# Patient Record
Sex: Male | Born: 1945 | Race: Black or African American | Hispanic: No | Smoking: Current every day smoker
Health system: Southern US, Community
[De-identification: ages and names within clinical notes are randomized; demographics above are authoritative.]

## PROBLEM LIST (undated history)

## (undated) DIAGNOSIS — E785 Hyperlipidemia, unspecified: Secondary | ICD-10-CM

## (undated) DIAGNOSIS — D649 Anemia, unspecified: Secondary | ICD-10-CM

## (undated) DIAGNOSIS — N200 Calculus of kidney: Secondary | ICD-10-CM

## (undated) DIAGNOSIS — D126 Benign neoplasm of colon, unspecified: Secondary | ICD-10-CM

## (undated) HISTORY — DX: Hyperlipidemia, unspecified: E78.5

## (undated) HISTORY — PX: COLONOSCOPY W/ POLYPECTOMY: SHX1380

## (undated) HISTORY — DX: Calculus of kidney: N20.0

## (undated) HISTORY — DX: Anemia, unspecified: D64.9

## (undated) HISTORY — DX: Benign neoplasm of colon, unspecified: D12.6

## (undated) HISTORY — PX: INGUINAL HERNIA REPAIR: SHX194

---

## 2000-04-24 ENCOUNTER — Ambulatory Visit (HOSPITAL_COMMUNITY): Admission: RE | Admit: 2000-04-24 | Discharge: 2000-04-24 | Payer: Self-pay | Admitting: Gastroenterology

## 2002-03-10 ENCOUNTER — Encounter: Payer: Self-pay | Admitting: Cardiology

## 2002-03-10 ENCOUNTER — Encounter: Admission: RE | Admit: 2002-03-10 | Discharge: 2002-03-10 | Payer: Self-pay | Admitting: Cardiology

## 2002-03-30 ENCOUNTER — Encounter: Payer: Self-pay | Admitting: Cardiology

## 2002-03-30 ENCOUNTER — Encounter: Admission: RE | Admit: 2002-03-30 | Discharge: 2002-03-30 | Payer: Self-pay | Admitting: Cardiology

## 2003-04-08 ENCOUNTER — Encounter: Admission: RE | Admit: 2003-04-08 | Discharge: 2003-04-08 | Payer: Self-pay | Admitting: Internal Medicine

## 2003-04-08 ENCOUNTER — Encounter: Payer: Self-pay | Admitting: Internal Medicine

## 2004-03-30 ENCOUNTER — Encounter: Payer: Self-pay | Admitting: Internal Medicine

## 2004-11-26 DIAGNOSIS — D649 Anemia, unspecified: Secondary | ICD-10-CM

## 2004-11-26 HISTORY — DX: Anemia, unspecified: D64.9

## 2005-03-21 ENCOUNTER — Ambulatory Visit: Payer: Self-pay | Admitting: Internal Medicine

## 2005-04-09 ENCOUNTER — Ambulatory Visit: Payer: Self-pay | Admitting: Internal Medicine

## 2005-04-19 ENCOUNTER — Ambulatory Visit: Payer: Self-pay | Admitting: Internal Medicine

## 2005-04-27 ENCOUNTER — Ambulatory Visit: Payer: Self-pay | Admitting: Oncology

## 2005-05-02 ENCOUNTER — Ambulatory Visit: Payer: Self-pay | Admitting: Internal Medicine

## 2005-10-29 ENCOUNTER — Ambulatory Visit (HOSPITAL_COMMUNITY): Admission: RE | Admit: 2005-10-29 | Discharge: 2005-10-29 | Payer: Self-pay | Admitting: Gastroenterology

## 2005-10-29 ENCOUNTER — Encounter (INDEPENDENT_AMBULATORY_CARE_PROVIDER_SITE_OTHER): Payer: Self-pay | Admitting: *Deleted

## 2006-02-07 ENCOUNTER — Ambulatory Visit: Payer: Self-pay | Admitting: Internal Medicine

## 2006-03-25 ENCOUNTER — Ambulatory Visit: Payer: Self-pay | Admitting: Internal Medicine

## 2007-01-23 ENCOUNTER — Ambulatory Visit: Payer: Self-pay | Admitting: Internal Medicine

## 2007-01-23 LAB — CONVERTED CEMR LAB
ALT: 22 units/L (ref 0–40)
AST: 27 units/L (ref 0–37)
Albumin: 3.9 g/dL (ref 3.5–5.2)
Alkaline Phosphatase: 59 units/L (ref 39–117)
BUN: 9 mg/dL (ref 6–23)
Basophils Absolute: 0 10*3/uL (ref 0.0–0.1)
Basophils Relative: 0 % (ref 0.0–1.0)
Bilirubin, Direct: 0.1 mg/dL (ref 0.0–0.3)
CO2: 29 meq/L (ref 19–32)
Calcium: 9.2 mg/dL (ref 8.4–10.5)
Chloride: 101 meq/L (ref 96–112)
Cholesterol: 176 mg/dL (ref 0–200)
Creatinine, Ser: 1 mg/dL (ref 0.4–1.5)
Eosinophils Absolute: 0.2 10*3/uL (ref 0.0–0.6)
Eosinophils Relative: 4.5 % (ref 0.0–5.0)
Folate: 13.3 ng/mL
Free T4: 0.9 ng/dL (ref 0.6–1.6)
GFR calc Af Amer: 98 mL/min
GFR calc non Af Amer: 81 mL/min
Glucose, Bld: 85 mg/dL (ref 70–99)
HCT: 38.9 % — ABNORMAL LOW (ref 39.0–52.0)
HDL: 56.5 mg/dL (ref >39.0)
Hemoglobin: 13 g/dL (ref 13.0–17.0)
Iron: 96 ug/dL (ref 42–165)
LDL Cholesterol: 108 mg/dL — ABNORMAL HIGH (ref 0–99)
Lymphocytes Relative: 31.3 % (ref 12.0–46.0)
MCHC: 33.4 g/dL (ref 30.0–36.0)
MCV: 91 fL (ref 78.0–100.0)
Monocytes Absolute: 0.3 10*3/uL (ref 0.2–0.7)
Monocytes Relative: 6.3 % (ref 3.0–11.0)
Neutro Abs: 2.8 10*3/uL (ref 1.4–7.7)
Neutrophils Relative %: 57.9 % (ref 43.0–77.0)
PSA: 1.02 ng/mL (ref 0.10–4.00)
Platelets: 189 10*3/uL (ref 150–400)
Potassium: 3.7 meq/L (ref 3.5–5.1)
RBC: 4.27 M/uL (ref 4.22–5.81)
RDW: 12.7 % (ref 11.5–14.6)
Saturation Ratios: 33.3 % (ref 20.0–50.0)
Sodium: 137 meq/L (ref 135–145)
T3, Free: 3.5 pg/mL (ref 2.3–4.2)
TSH: 0.7 microintl units/mL (ref 0.35–5.50)
Total Bilirubin: 0.7 mg/dL (ref 0.3–1.2)
Total CHOL/HDL Ratio: 3.1
Total Protein: 8 g/dL (ref 6.0–8.3)
Transferrin: 205.7 mg/dL — ABNORMAL LOW (ref >=212.0)
Triglycerides: 58 mg/dL (ref 0–149)
VLDL: 12 mg/dL (ref 0–40)
Vitamin B-12: 704 pg/mL (ref 211–911)
WBC: 4.8 10*3/uL (ref 4.5–10.5)

## 2008-07-15 ENCOUNTER — Ambulatory Visit: Payer: Self-pay | Admitting: Internal Medicine

## 2008-07-15 DIAGNOSIS — Z860100 Personal history of colon polyps, unspecified: Secondary | ICD-10-CM | POA: Insufficient documentation

## 2008-07-15 DIAGNOSIS — E785 Hyperlipidemia, unspecified: Secondary | ICD-10-CM | POA: Insufficient documentation

## 2008-07-15 DIAGNOSIS — R930 Abnormal findings on diagnostic imaging of skull and head, not elsewhere classified: Secondary | ICD-10-CM

## 2008-07-15 DIAGNOSIS — F17200 Nicotine dependence, unspecified, uncomplicated: Secondary | ICD-10-CM

## 2008-07-15 DIAGNOSIS — Z862 Personal history of diseases of the blood and blood-forming organs and certain disorders involving the immune mechanism: Secondary | ICD-10-CM | POA: Insufficient documentation

## 2008-07-15 DIAGNOSIS — Z87442 Personal history of urinary calculi: Secondary | ICD-10-CM | POA: Insufficient documentation

## 2008-07-16 ENCOUNTER — Encounter (INDEPENDENT_AMBULATORY_CARE_PROVIDER_SITE_OTHER): Payer: Self-pay | Admitting: *Deleted

## 2008-07-23 ENCOUNTER — Ambulatory Visit: Payer: Self-pay | Admitting: Internal Medicine

## 2008-07-24 ENCOUNTER — Encounter (INDEPENDENT_AMBULATORY_CARE_PROVIDER_SITE_OTHER): Payer: Self-pay | Admitting: *Deleted

## 2008-07-24 LAB — CONVERTED CEMR LAB
Basophils Absolute: 0 10*3/uL (ref 0.0–0.1)
Basophils Relative: 0.7 % (ref 0.0–3.0)
Eosinophils Absolute: 0.2 10*3/uL (ref 0.0–0.7)
Eosinophils Relative: 4.1 % (ref 0.0–5.0)
Folate: >20 ng/mL
HCT: 36.8 % — ABNORMAL LOW (ref 39.0–52.0)
Hemoglobin: 12.3 g/dL — ABNORMAL LOW (ref 13.0–17.0)
Iron: 80 ug/dL (ref 42–165)
Lymphocytes Relative: 33.8 % (ref 12.0–46.0)
MCHC: 33.5 g/dL (ref 30.0–36.0)
MCV: 92 fL (ref 78.0–100.0)
Monocytes Absolute: 0.5 10*3/uL (ref 0.1–1.0)
Monocytes Relative: 11.4 % (ref 3.0–12.0)
Neutro Abs: 2.3 10*3/uL (ref 1.4–7.7)
Neutrophils Relative %: 50 % (ref 43.0–77.0)
Platelets: 210 10*3/uL (ref 150–400)
RBC: 4 M/uL — ABNORMAL LOW (ref 4.22–5.81)
RDW: 12.2 % (ref 11.5–14.6)
Saturation Ratios: 33.4 % (ref 20.0–50.0)
Transferrin: 171.2 mg/dL — ABNORMAL LOW (ref >=212.0)
Vitamin B-12: 592 pg/mL (ref 211–911)
WBC: 4.5 10*3/uL (ref 4.5–10.5)

## 2008-07-26 ENCOUNTER — Encounter (INDEPENDENT_AMBULATORY_CARE_PROVIDER_SITE_OTHER): Payer: Self-pay | Admitting: *Deleted

## 2008-07-27 ENCOUNTER — Encounter (INDEPENDENT_AMBULATORY_CARE_PROVIDER_SITE_OTHER): Payer: Self-pay | Admitting: *Deleted

## 2008-07-27 ENCOUNTER — Ambulatory Visit: Payer: Self-pay | Admitting: Internal Medicine

## 2008-07-27 LAB — CONVERTED CEMR LAB
OCCULT 1: NEGATIVE
OCCULT 3: NEGATIVE

## 2008-08-16 ENCOUNTER — Ambulatory Visit: Payer: Self-pay | Admitting: Gastroenterology

## 2009-08-17 ENCOUNTER — Ambulatory Visit: Payer: Self-pay | Admitting: Internal Medicine

## 2009-08-23 ENCOUNTER — Ambulatory Visit: Payer: Self-pay | Admitting: Internal Medicine

## 2009-08-23 LAB — CONVERTED CEMR LAB
OCCULT 2: POSITIVE
OCCULT 3: NEGATIVE

## 2009-08-24 ENCOUNTER — Telehealth: Payer: Self-pay | Admitting: Internal Medicine

## 2009-09-26 ENCOUNTER — Telehealth (INDEPENDENT_AMBULATORY_CARE_PROVIDER_SITE_OTHER): Payer: Self-pay | Admitting: *Deleted

## 2009-09-26 ENCOUNTER — Ambulatory Visit: Payer: Self-pay | Admitting: Gastroenterology

## 2009-10-03 ENCOUNTER — Telehealth: Payer: Self-pay | Admitting: Gastroenterology

## 2009-10-10 ENCOUNTER — Ambulatory Visit: Payer: Self-pay | Admitting: Gastroenterology

## 2009-10-11 ENCOUNTER — Encounter: Payer: Self-pay | Admitting: Gastroenterology

## 2009-11-09 ENCOUNTER — Ambulatory Visit: Payer: Self-pay | Admitting: Gastroenterology

## 2009-11-15 ENCOUNTER — Ambulatory Visit: Payer: Self-pay | Admitting: Gastroenterology

## 2009-11-22 ENCOUNTER — Encounter: Payer: Self-pay | Admitting: Gastroenterology

## 2009-11-26 HISTORY — PX: COLONOSCOPY: SHX174

## 2010-08-22 ENCOUNTER — Ambulatory Visit: Payer: Self-pay | Admitting: Internal Medicine

## 2010-08-23 ENCOUNTER — Ambulatory Visit: Payer: Self-pay | Admitting: Internal Medicine

## 2010-08-24 LAB — CONVERTED CEMR LAB
ALT: 16 units/L (ref 0–53)
AST: 22 units/L (ref 0–37)
Alkaline Phosphatase: 55 units/L (ref 39–117)
Chloride: 106 meq/L (ref 96–112)
Eosinophils Relative: 4.6 % (ref 0.0–5.0)
GFR calc non Af Amer: 85.25 mL/min (ref >60)
Glucose, Bld: 83 mg/dL (ref 70–99)
LDL Cholesterol: 96 mg/dL (ref 0–99)
Lymphocytes Relative: 34.6 % (ref 12.0–46.0)
Monocytes Relative: 11.5 % (ref 3.0–12.0)
Neutrophils Relative %: 49 % (ref 43.0–77.0)
PSA: 0.96 ng/mL (ref 0.10–4.00)
Platelets: 161 10*3/uL (ref 150.0–400.0)
Potassium: 5.2 meq/L — ABNORMAL HIGH (ref 3.5–5.1)
Sodium: 141 meq/L (ref 135–145)
TSH: 0.83 microintl units/mL (ref 0.35–5.50)
Total Bilirubin: 0.4 mg/dL (ref 0.3–1.2)
VLDL: 7.6 mg/dL (ref 0.0–40.0)
WBC: 3.5 10*3/uL — ABNORMAL LOW (ref 4.5–10.5)

## 2010-11-28 ENCOUNTER — Ambulatory Visit
Admission: RE | Admit: 2010-11-28 | Discharge: 2010-11-28 | Payer: Self-pay | Source: Home / Self Care | Attending: Internal Medicine | Admitting: Internal Medicine

## 2010-11-28 DIAGNOSIS — I498 Other specified cardiac arrhythmias: Secondary | ICD-10-CM | POA: Insufficient documentation

## 2010-11-28 DIAGNOSIS — R42 Dizziness and giddiness: Secondary | ICD-10-CM | POA: Insufficient documentation

## 2010-11-29 ENCOUNTER — Telehealth: Payer: Self-pay | Admitting: Internal Medicine

## 2010-12-24 LAB — CONVERTED CEMR LAB
ALT: 21 units/L (ref 0–53)
ALT: 34 units/L (ref 0–53)
AST: 33 units/L (ref 0–37)
Albumin: 3.9 g/dL (ref 3.5–5.2)
Alkaline Phosphatase: 61 units/L (ref 39–117)
BUN: 11 mg/dL (ref 6–23)
BUN: 13 mg/dL (ref 6–23)
Basophils Absolute: 0 10*3/uL (ref 0.0–0.1)
Basophils Relative: 0.7 % (ref 0.0–3.0)
Bilirubin, Direct: 0 mg/dL (ref 0.0–0.3)
Bilirubin, Direct: 0.1 mg/dL (ref 0.0–0.3)
CO2: 30 meq/L (ref 19–32)
CO2: 30 meq/L (ref 19–32)
Calcium: 9.1 mg/dL (ref 8.4–10.5)
Chloride: 105 meq/L (ref 96–112)
Chloride: 109 meq/L (ref 96–112)
Cholesterol, target level: 200 mg/dL
Cholesterol: 162 mg/dL (ref 0–200)
Cholesterol: 176 mg/dL (ref 0–200)
Creatinine, Ser: 1 mg/dL (ref 0.4–1.5)
Creatinine, Ser: 1.1 mg/dL (ref 0.4–1.5)
Eosinophils Absolute: 0.1 10*3/uL (ref 0.0–0.7)
Eosinophils Absolute: 0.3 10*3/uL (ref 0.0–0.7)
Eosinophils Relative: 2.8 % (ref 0.0–5.0)
Eosinophils Relative: 7.2 % — ABNORMAL HIGH (ref 0.0–5.0)
Folate: 16.9 ng/mL
GFR calc Af Amer: 98 mL/min
GFR calc non Af Amer: 81 mL/min
Glucose, Bld: 82 mg/dL (ref 70–99)
Glucose, Bld: 83 mg/dL (ref 70–99)
HCT: 37.8 % — ABNORMAL LOW (ref 39.0–52.0)
HCT: 38.6 % — ABNORMAL LOW (ref 39.0–52.0)
HDL goal, serum: 40 mg/dL
HDL: 53.5 mg/dL (ref >39.0)
Hemoglobin: 12.9 g/dL — ABNORMAL LOW (ref 13.0–17.0)
LDL Cholesterol: 111 mg/dL — ABNORMAL HIGH (ref 0–99)
LDL Cholesterol: 98 mg/dL (ref 0–99)
LDL Goal: 100 mg/dL
Lymphocytes Relative: 23 % (ref 12.0–46.0)
Lymphs Abs: 1 10*3/uL (ref 0.7–4.0)
MCHC: 33.5 g/dL (ref 30.0–36.0)
MCHC: 34.2 g/dL (ref 30.0–36.0)
MCV: 92.1 fL (ref 78.0–100.0)
MCV: 92.4 fL (ref 78.0–100.0)
Monocytes Absolute: 0.3 10*3/uL (ref 0.1–1.0)
Monocytes Absolute: 0.3 10*3/uL (ref 0.1–1.0)
Monocytes Relative: 8.2 % (ref 3.0–12.0)
Neutro Abs: 2.8 10*3/uL (ref 1.4–7.7)
Neutrophils Relative %: 58.5 % (ref 43.0–77.0)
Neutrophils Relative %: 65.3 % (ref 43.0–77.0)
PSA: 0.97 ng/mL (ref 0.10–4.00)
Platelets: 144 10*3/uL — ABNORMAL LOW (ref 150.0–400.0)
Platelets: 190 10*3/uL (ref 150–400)
Potassium: 3.8 meq/L (ref 3.5–5.1)
Potassium: 4.1 meq/L (ref 3.5–5.1)
RBC: 4.11 M/uL — ABNORMAL LOW (ref 4.22–5.81)
RDW: 12.3 % (ref 11.5–14.6)
RDW: 12.3 % (ref 11.5–14.6)
Sodium: 139 meq/L (ref 135–145)
TSH: 0.45 microintl units/mL (ref 0.35–5.50)
TSH: 0.96 microintl units/mL (ref 0.35–5.50)
Total Bilirubin: 0.8 mg/dL (ref 0.3–1.2)
Total Bilirubin: 1 mg/dL (ref 0.3–1.2)
Total CHOL/HDL Ratio: 3.3
Total Protein: 7.9 g/dL (ref 6.0–8.3)
Transferrin: 182.5 mg/dL — ABNORMAL LOW (ref 212.0–360.0)
Triglycerides: 40 mg/dL (ref 0.0–149.0)
Triglycerides: 56 mg/dL (ref 0–149)
VLDL: 11 mg/dL (ref 0–40)
Vitamin B-12: 479 pg/mL (ref 211–911)
WBC: 3.9 10*3/uL — ABNORMAL LOW (ref 4.5–10.5)
WBC: 4.2 10*3/uL — ABNORMAL LOW (ref 4.5–10.5)

## 2010-12-26 NOTE — Letter (Signed)
Summary: [facility] Profile / [place]  [facility] Profile / [place]   Imported By: Lennie Odor 08/28/2010 15:35:42  _____________________________________________________________________  External Attachment:    Type:   Image     Comment:   External Document

## 2010-12-26 NOTE — Assessment & Plan Note (Signed)
Summary: cpx//pt will be fasting//lch   Vital Signs:  Patient profile:   65 year old male Height:      68 inches Weight:      141.2 pounds BMI:     21.55 Temp:     97.6 degrees F oral Pulse rate:   62 / minute Resp:     14 per minute BP sitting:   132 / 75  (left arm) Cuff size:   regular  Vitals Entered By: Shonna Chock CMA (August 22, 2010 8:29 AM)  Comments Patient had Flu Vaccine Yesterday   Primary Care Provider:  Marga Melnick, MD   History of Present Illness: Mr. Dearden is here for a physical; he is asymptomatic.  Lipid Management History:      Positive NCEP/ATP III risk factors include male age 31 years old or older and current tobacco user.  Negative NCEP/ATP III risk factors include non-diabetic, no family history for ischemic heart disease, non-hypertensive, no ASHD (atherosclerotic heart disease), no prior stroke/TIA, no peripheral vascular disease, and no history of aortic aneurysm.     Allergies (verified): No Known Drug Allergies  Past History:  Past Medical History: Abnormal CXray : calcified nodule 2004 Anemia-NOS, PMH of  Colonic polyps, PMH  of, adenomatous  Hyperlipidemia:NMR Lipoprofile : LDL 135 (1756 total /978 small dense ) with 17% risk; LDL goal = < 100. [place] Study LDL goal = < 130. Nephrolithiasis,PMH  of X 2([place])  Past Surgical History: Endoscopy  in  2006 for anemia: negative  Colon polypectomy 2002 , 2006 . Diverticulum ascending colon & hemorrhoids  2010, Dr Barnet Pall Inguinal herniorrhaphy  Family History: Father: health history unknown Mother: HTN, arthritis Siblings: negative MGM: pacer; M aunt :HTN;M uncle: DM; maternal FH renal failure due to ? No FH of Colon Cancer:  Social History: no diet Occupation: Scientist, forensic @ [employer] Patient currently smokes. 5 cigarettes  daily Alcohol Use - yes -2 glasses wine daily Daily Caffeine Use -1 cup /day Regular exercise-yes: swimming 1/2 mpd 3-5X/week  Review  of Systems  The patient denies anorexia, fever, weight loss, weight gain, vision loss, decreased hearing, hoarseness, chest pain, syncope, dyspnea on exertion, peripheral edema, prolonged cough, headaches, hemoptysis, abdominal pain, melena, hematochezia, severe indigestion/heartburn, hematuria, suspicious skin lesions, depression, unusual weight change, abnormal bleeding, enlarged lymph nodes, and angioedema.    Physical Exam  General:  Thin but well-nourished,alert,appropriate and cooperative throughout examination Head:  Normocephalic and atraumatic without obvious abnormalities. No apparent alopecia  Eyes:  No corneal or conjunctival inflammation noted. Perrla. Funduscopic exam benign, without hemorrhages, exudates or papilledema.  Ears:  External ear exam shows no significant lesions or deformities.  Otoscopic examination reveals clear canals, tympanic membranes are intact bilaterally without bulging, retraction, inflammation or discharge. Hearing is grossly normal bilaterally. Nose:  External nasal examination shows no deformity or inflammation. Nasal mucosa are pink and moist without lesions or exudates. Mouth:  Oral mucosa and oropharynx without lesions or exudates.  Teeth in good repair. Neck:  No deformities, masses, or tenderness noted. Lungs:  Normal respiratory effort, chest expands symmetrically. Lungs are clear to auscultation, no crackles or wheezes. Heart:  regular rhythm, no murmur, no gallop, no rub, no JVD, no HJR, and bradycardia.  S4 Abdomen:  Bowel sounds positive,abdomen soft and non-tender without masses, organomegaly or hernias noted. Rectal:  No external abnormalities noted. Normal sphincter tone. No rectal masses or tenderness. Genitalia:  Testes bilaterally descended without nodularity, tenderness or masses. No scrotal masses or lesions.  No penis lesions or urethral discharge. Prostate:  Prostate gland firm and smooth, ULN size , w/o nodularity, tenderness, mass,  asymmetry or induration. Msk:  No deformity or scoliosis noted of thoracic or lumbar spine.   Pulses:  R and L carotid,radial,dorsalis pedis and posterior tibial pulses are full and equal bilaterally Extremities:  No clubbing, cyanosis, edema, or deformity noted with normal full range of motion of all joints.   Neurologic:  alert & oriented X3 and DTRs symmetrical and normal.   Skin:  Intact without suspicious lesions or rashes Cervical Nodes:  No lymphadenopathy noted Axillary Nodes:  No palpable lymphadenopathy Inguinal Nodes:  No significant adenopathy Psych:  memory intact for recent and remote, normally interactive, and good eye contact.     Impression & Recommendations:  Problem # 1:  ROUTINE GENERAL MEDICAL EXAM@HEALTH  CARE FACL (ICD-V70.0)  Orders: EKG w/ Interpretation (93000)  Problem # 2:  HYPERLIPIDEMIA (ICD-272.4)  Problem # 3:  CHEST XRAY, ABNORMAL (ICD-793.1)  Orders: T-2 View CXR (71020TC)  Problem # 4:  NONDEPENDENT TOBACCO USE DISORDER (ICD-305.1)  Orders: T-2 View CXR (71020TC)  Complete Medication List: 1)  Fish Oil 1000 Mg Caps (Omega-3 fatty acids) .... Once daily 2)  Vitamin C Cr 1000 Mg Cr-tabs (Ascorbic acid) .... Once daily 3)  Multivitamins Tabs (Multiple vitamin) .... Once daily 4)  Aspir-81 81 Mg Tbec (Aspirin) .... Once daily 5)  Vitamin D 1000 Unit Tabs (Cholecalciferol) .... One tablet by mouth two times a day  Lipid Assessment/Plan:      Based on NCEP/ATP III, the patient's risk factor category is "2 or more risk factors and a calculated 10 year CAD risk of < 20%".  The patient's lipid goals are as follows: Total cholesterol goal is 200; LDL cholesterol goal is 100; HDL cholesterol goal is 40; Triglyceride goal is 150.  His LDL cholesterol goal has not been met.  Secondary causes for hyperlipidemia have been ruled out.  He has been counseled on adjunctive measures for lowering his cholesterol and has been provided with dietary instructions.     Patient Instructions: 1)  Schedule fasting labs @ [facility]; see Diagnoses for Codes:  2)  BMP ; 3)  Hepatic Panel; 4)  Lipid Panel; 5)  TSH ; 6)  CBC w/ Diff; 7)  PSA . 8)  Take an  81 mg coated Aspirin every day.   Immunization History:  Influenza Immunization History:    Influenza:  historical (08/21/2010)

## 2010-12-28 NOTE — Progress Notes (Signed)
Summary: Med update  Phone Note Call from Patient Call back at Work Phone [phone]   Summary of Call: Patient called to corrrect his med list. Corrections made. Initial call taken by: Lucious Groves CMA,  November 29, 2010 2:31 PM    New/Updated Medications: FISH OIL 1000 MG CAPS (OMEGA-3 FATTY ACIDS) 1 by mouth tid VITAMIN D 1000 UNIT  TABS (CHOLECALCIFEROL) 1 by mouth qd * BENEFIBER by mouth three times a day

## 2010-12-28 NOTE — Assessment & Plan Note (Signed)
Summary: dizzyness//lch   Vital Signs:  Patient profile:   65 year old male Weight:      146.6 pounds BMI:     22.37 Temp:     98.1 degrees F oral Pulse (ortho):   57 / minute Resp:     14 per minute BP supine:   110 / 70 BP sitting:   120 / 76 BP standing:   120 / 80  Vitals Entered By: Shonna Chock CMA (November 28, 2010 4:59 PM) CC: Dizziness since Friday (off/on) , Syncope   Serial Vital Signs/Assessments:  Time      Position  BP       Pulse  Resp  Temp     By 4:59 PM   Lying LA  110/70   54                    Chrae Malloy CMA 4:59 PM   Sitting   120/76   58                    Chrae Malloy CMA 4:59 PM   Standing  120/80   57                    Chrae Malloy CMA   Primary Care Provider:  Marga Melnick, MD  CC:  Dizziness since Friday (off/on)  and Syncope.  History of Present Illness:      This is a 65 year old man who presents with  intermittent  dizziness , lasting < 1 minute since 11/24/2010 w/o trigger.  The patient reports  no premonitory symptoms ; he describes  only  lightheadedness. He denies loss of consciousness, near loss of consciousness, palpitations, chest pain, shortness of breath, and incontinence.  Associated symptoms include minor  headache,which has not been  treated .  The patient denies the following symptoms: abdominal discomfort, nausea, vomiting, feeling warm, pallor, diaphoresis, focal weakness, blurred vision, and perioral numbness.  The patient reports the following precipitating factors: change in position  of head, getting out of bed  or standing upright.  rx: none.  Allergies: No Known Drug Allergies  Review of Systems General:  Denies chills and sweats. Eyes:  Denies double vision and vision loss-both eyes. ENT:  Denies decreased hearing, ear discharge, earache, nasal congestion, and ringing in ears; No facial pain or purulence. GI:  Denies bloody stools and dark tarry stools. Neuro:  Denies disturbances in coordination, numbness, poor  balance, sensation of room spinning, tingling, and weakness; No definite BPV . Heme:  Denies abnormal bruising and bleeding.  Physical Exam  General:  Thin but well-nourished,in no acute distress; alert,appropriate and cooperative throughout examination Eyes:  No corneal or conjunctival inflammation noted. EOMI. Perrla. Field of  Vision grossly normal.Arcus bilaterally Ears:  External ear exam shows no significant lesions or deformities.  Otoscopic examination reveals clear canals, tympanic membranes are intact bilaterally without bulging, retraction, inflammation or discharge. Hearing is grossly normal bilaterally.Rinne  (BC>AC) and Weber  both  normal.   Mouth:  Oral mucosa and oropharynx without lesions or exudates.  Teeth in good repair. No tongue deviation Heart:  Normal rate and regular rhythm. S1 and S2 normal without gallop, murmur, click, rub or other extra sounds.regular rhythm, no murmur, no gallop, no rub, no JVD, and bradycardia.   Pulses:  R and L carotid,radial  are full and equal bilaterally w/o bruits Neurologic:  alert & oriented X3, cranial nerves II-XII intact,  strength normal in all extremities, sensation intact to light touch, gait normal, DTRs symmetrical and normal, finger-to-nose normal, and Romberg negative.   Skin:  Intact without suspicious lesions or rashes Cervical Nodes:  No lymphadenopathy noted Axillary Nodes:  No palpable lymphadenopathy   Impression & Recommendations:  Problem # 1:  DIZZINESS (ICD-780.4) probable postural hypotension; negative Neurovascular exam  Problem # 2:  BRADYCARDIA (ICD-427.89) exercises @ high level (swimming 30 min 5X/week); not on CCB or Beta blockers. Clinically no brady-tachy syndrome. His updated medication list for this problem includes:    Aspir-81 81 Mg Tbec (Aspirin) ..... Once daily  Complete Medication List: 1)  Fish Oil 1000 Mg Caps (Omega-3 fatty acids) .... Once daily 2)  Vitamin C Cr 1000 Mg Cr-tabs (Ascorbic  acid) .... Once daily 3)  Multivitamins Tabs (Multiple vitamin) .... Once daily 4)  Aspir-81 81 Mg Tbec (Aspirin) .... Once daily 5)  Vitamin D 1000 Unit Tabs (Cholecalciferol) .... One tablet by mouth two times a day  Patient Instructions: 1)  Isometrics pre standing as discussed. OTC Meclizine 25 mg every 6 hrs as needed for persistent symptoms. Labs & Holter monitor if symptoms fail to resolve.   Orders Added: 1)  Est. Patient Level IV [36644]

## 2011-04-13 NOTE — Procedures (Signed)
[place]  Patient:    Mathew Stafford, Mathew Stafford                     MRN: [id number] Proc. Date: 04/24/00 Adm. Date:  [id number] Disc. Date: [id number] Attending:  Charna Elizabeth CC:         Dr. Louanna Raw                           Procedure Report  DATE OF BIRTH:  05-22-1946.  REFERRING PHYSICIAN:  Dr. Louanna Raw.  PROCEDURE PERFORMED:  Colonoscopy.  ENDOSCOPIST:  Anselmo Rod, M.D.  INSTRUMENT USED:  Olympus video colonoscope.  INDICATION FOR PROCEDURE:  Rectal bleeding in a 65 year old black male with a questionable hemorrhoid on rectal exam versus mass.  The patient did not allow insertion of the anoscope, and therefore definite diagnosis could not be made during office visit.  Colonoscopy being done to rule out colonic masses, polyps, hemorrhoids, etc.  PREPROCEDURE PREPARATION:  Informed consent was procured from the patient. The patient was fasted for eight hours prior to the procedure and prepped with a bottle of magnesium citrate and a gallon of NuLytely the night prior to the procedure.  PREPROCEDURE PHYSICAL:  VITAL SIGNS:  The patient had stable vital signs.  NECK:  Supple.  CHEST:  Clear to auscultation.  S1, S2 regular.  ABDOMEN:  Soft with normal abdominal bowel sounds.  DESCRIPTION OF PROCEDURE:  The patient was placed in the left lateral decubitus position, sedated with 50 mg of Demerol and 5 mg of Versed intravenously.  Once the patient was adequately sedate and maintained on low-flow oxygen and continuous cardiac monitoring, the Olympus video colonoscope was advanced from the rectum to the cecum with slight difficulty secondary to what seemed to be a scarred anal sphincter which was slightly tight, _____ tight.  There were moderate-sized bleeding internal hemorrhoids seen on retroflexion in the rectum.  No other masses, polyps, erosions, ulcerations or diverticula were present.  The procedure was completed at  the cecum, where the appendiceal orifice and the ileocecal valve were clearly visualized.  IMPRESSION: 1. Moderate-sized bleeding internal hemorrhoids, otherwise normal colonoscopy. 2. Slightly scarred, tight, _____ tight anal sphincter. 3. Otherwise normal colonoscopy up to cecum.  RECOMMENDATION: 1. The patient has been advised to try Anusol-HC 2.5% suppositories for    symptomatic relief of his hemorrhoids. 2. A high-fiber diet with liberal fluid intake has been advocated. 3. Outpatient follow-up is advised in the next two weeks. 4. If his symptoms do not improve, a surgical referral will be made for    further evaluation and possible surgery for his hemorrhoids. DD:  04/24/00 TD:  04/27/00 Job: [id number]

## 2011-04-13 NOTE — Op Note (Signed)
NAME:  NADER, BOYS NO.:  [id number]   MEDICAL RECORD NO.:  [id number]          PATIENT TYPE:  AMB   LOCATION:  ENDO                         FACILITY:  [facility]   PHYSICIAN:  Anselmo Rod, M.D.  DATE OF BIRTH:  1946/06/01   DATE OF PROCEDURE:  10/29/2005  DATE OF DISCHARGE:                                 OPERATIVE REPORT   PROCEDURE:  Colonoscopy with snare polypectomy x 1.   ENDOSCOPIST:  Charna Elizabeth, M.D.   INSTRUMENT USED:  Olympus video colonoscope.   INDICATIONS FOR PROCEDURE:  65 year old [place] American male with a history  of polyps in the past undergoing a repeat colonoscopy to rule out recurrent  polyps. The patient has a history of occasional rectal bleeding which is  attributed to hemorrhoids.   PREPROCEDURE PREPARATION:  Informed consent was obtained from the patient.  The patient was fasted for four hours prior to the procedure and prepped  Osmo prep pills the morning of and the night of the procedure.  The risks  and benefits of the procedure including a 10% miss rate of cancer and polyps  were discussed with the patient, as well.   PREPROCEDURE PHYSICAL:  Patient with stable vital signs.  Neck supple.  Chest clear to auscultation.  S1 and S2 regular.  Abdomen soft with normal  bowel sounds.   DESCRIPTION OF PROCEDURE:  The patient was placed in the left lateral  decubitus position, sedated with 75 mcg Fentanyl and 8 mg Versed in slow  incremental doses.  Once the patient was adequately sedated, maintained on  low flow oxygen and continuous cardiac monitoring, the Olympus video  colonoscope was advanced from the rectum to the cecum.  The appendiceal  orifice and ileocecal valve were clearly visualized and photographed.  There  was some residual stool in the right colon, multiple washes were done.  No  erosions, ulcerations, diverticula, etc., were seen.  A small sessile polyp  was removed by cold snare from the rectum.  Small internal  hemorrhoids were  seen on retroflexion of the rectum.  The patient tolerated the procedure  well without immediate complications.   IMPRESSION:  1.Small sessile polyp removed by cold snare from the rectum,  otherwise, normal colonoscopy to the cecum.  2.Small internal hemorrhoids.   RECOMMENDATIONS:  1.Await pathology results.  2.Avoid non-steroidals including aspirin for the next two weeks.  3.Repeat colonoscopy depending on pathology results.  4.Outpatient follow up as need arises in the future.      Anselmo Rod, M.D.  Electronically Signed     JNM/MEDQ  D:  10/29/2005  T:  10/29/2005  Job:  [id number]   cc:   Titus Dubin. Alwyn Ren, M.D. [facility]  [id number] [place]  [place] [address]

## 2011-10-11 ENCOUNTER — Telehealth: Payer: Self-pay | Admitting: *Deleted

## 2011-10-11 NOTE — Telephone Encounter (Signed)
Pt states he woke up this morning with pain in his lower back and difficulty moving. Pt went to work but is leaving as his symptoms have not improved. Pt states he has tried icy hot and OTC back pain formula without relief. Advised pt that Dr Alwyn Ren did not have any openings today but could see him tomorrow. Pt states he will go to urgent care.

## 2011-10-26 ENCOUNTER — Encounter: Payer: Self-pay | Admitting: Internal Medicine

## 2011-11-01 ENCOUNTER — Encounter: Payer: Self-pay | Admitting: Internal Medicine

## 2011-11-02 ENCOUNTER — Ambulatory Visit (INDEPENDENT_AMBULATORY_CARE_PROVIDER_SITE_OTHER)
Admission: RE | Admit: 2011-11-02 | Discharge: 2011-11-02 | Disposition: A | Discharge status: Home / Self Care | Payer: 59 | Source: Ambulatory Visit | Attending: Internal Medicine | Admitting: Internal Medicine

## 2011-11-02 ENCOUNTER — Encounter: Payer: Self-pay | Admitting: Internal Medicine

## 2011-11-02 ENCOUNTER — Ambulatory Visit (INDEPENDENT_AMBULATORY_CARE_PROVIDER_SITE_OTHER): Payer: 59 | Admitting: Internal Medicine

## 2011-11-02 VITALS — BP 128/80 | HR 70 | Temp 98.2°F | Resp 12 | Ht 68.0 in | Wt 143.6 lb

## 2011-11-02 DIAGNOSIS — Z23 Encounter for immunization: Secondary | ICD-10-CM

## 2011-11-02 DIAGNOSIS — D649 Anemia, unspecified: Secondary | ICD-10-CM

## 2011-11-02 DIAGNOSIS — F17200 Nicotine dependence, unspecified, uncomplicated: Secondary | ICD-10-CM

## 2011-11-02 DIAGNOSIS — Z0000 Encounter for general adult medical examination without abnormal findings: Secondary | ICD-10-CM

## 2011-11-02 DIAGNOSIS — R930 Abnormal findings on diagnostic imaging of skull and head, not elsewhere classified: Secondary | ICD-10-CM

## 2011-11-02 DIAGNOSIS — E785 Hyperlipidemia, unspecified: Secondary | ICD-10-CM

## 2011-11-02 LAB — LIPID PANEL
Cholesterol: 168 mg/dL (ref 0–200)
HDL: 65.4 mg/dL
LDL Cholesterol: 95 mg/dL (ref 0–99)
Total CHOL/HDL Ratio: 3
Triglycerides: 37 mg/dL (ref 0.0–149.0)
VLDL: 7.4 mg/dL (ref 0.0–40.0)

## 2011-11-02 LAB — BASIC METABOLIC PANEL WITH GFR
BUN: 17 mg/dL (ref 6–23)
CO2: 27 meq/L (ref 19–32)
Calcium: 9.2 mg/dL (ref 8.4–10.5)
Chloride: 105 meq/L (ref 96–112)
Creatinine, Ser: 1.1 mg/dL (ref 0.4–1.5)
GFR: 85.81 mL/min
Glucose, Bld: 77 mg/dL (ref 70–99)
Potassium: 4 meq/L (ref 3.5–5.1)
Sodium: 140 meq/L (ref 135–145)

## 2011-11-02 LAB — CBC WITH DIFFERENTIAL/PLATELET
Basophils Relative: 0.5 % (ref 0.0–3.0)
Eosinophils Absolute: 0.2 10*3/uL (ref 0.0–0.7)
Eosinophils Relative: 4.5 % (ref 0.0–5.0)
HCT: 37.7 % — ABNORMAL LOW (ref 39.0–52.0)
Lymphs Abs: 1.3 10*3/uL (ref 0.7–4.0)
MCHC: 33.8 g/dL (ref 30.0–36.0)
MCV: 91.6 fl (ref 78.0–100.0)
Monocytes Absolute: 0.4 10*3/uL (ref 0.1–1.0)
Neutro Abs: 2.4 10*3/uL (ref 1.4–7.7)
Neutrophils Relative %: 55.5 % (ref 43.0–77.0)
RBC: 4.11 Mil/uL — ABNORMAL LOW (ref 4.22–5.81)
WBC: 4.3 10*3/uL — ABNORMAL LOW (ref 4.5–10.5)

## 2011-11-02 LAB — HEPATIC FUNCTION PANEL
Alkaline Phosphatase: 53 U/L (ref 39–117)
Bilirubin, Direct: 0.1 mg/dL (ref 0.0–0.3)
Total Bilirubin: 1 mg/dL (ref 0.3–1.2)
Total Protein: 8.1 g/dL (ref 6.0–8.3)

## 2011-11-02 LAB — TSH: TSH: 0.61 u[IU]/mL (ref 0.35–5.50)

## 2011-11-02 LAB — IBC PANEL
Iron: 149 ug/dL (ref 42–165)
Saturation Ratios: 52.9 % — ABNORMAL HIGH (ref 20.0–50.0)
Transferrin: 201.2 mg/dL — ABNORMAL LOW (ref 212.0–360.0)

## 2011-11-02 NOTE — Progress Notes (Signed)
Addended by: Maurice Small on: 11/02/2011 09:28 AM   Modules accepted: Orders

## 2011-11-02 NOTE — Patient Instructions (Signed)
Preventive Health Care: Exercise at least 30-45 minutes a day,  3-4 days a week.  Eat a low-fat diet with lots of fruits and vegetables, up to 7-9 servings per day. Consume less than 40 grams of sugar per day from foods & drinks with High Fructose Corn Sugar as #1,2,3 or # 4 on label. Health Care Power of Attorney & Living Will. Complete if not in place ; these place you in charge of your health care decisions. Order for x-rays entered into  the computer; these will be performed at [address] [facility]. across from [facility]. No appointment is necessary. Consider  [facility]'s smoking cessation program @ [email or web address] or [phone].

## 2011-11-02 NOTE — Progress Notes (Signed)
Subjective:    Patient ID: Mathew Stafford, male    DOB: [birth year], 65 y.o.   MRN: [id number]  HPI  Mathew Stafford is here for a physical;acute issues include recent non traumatic  low back pain which resolved with meds from UC.      Review of Systems Patient reports no  vision/ hearing changes,anorexia, weight change, fever ,adenopathy, persistant / recurrent hoarseness, swallowing issues, chest pain,palpitations, edema,persistant / recurrent cough, hemoptysis, dyspnea(rest, exertional, paroxysmal nocturnal), gastrointestinal  bleeding (melena, rectal bleeding), abdominal pain, excessive heart burn, GU symptoms( dysuria, hematuria, pyuria, voiding/incontinence  issues) syncope, focal weakness, memory loss,numbness & tingling, skin/hair/nail changes,depression, anxiety,or abnormal bruising/bleeding .                                           Objective:   Physical Exam Gen.: Thin but healthy and well-nourished in appearance. Alert, appropriate and cooperative throughout exam. Head: Normocephalic without obvious abnormalities;  Beard & moustache Eyes: No corneal or conjunctival inflammation noted. Pupils equal round reactive to light and accommodation. Fundal exam is benign without hemorrhages, exudate, papilledema. Extraocular motion intact. Vision grossly normal. Arcus senilis Ears: External  ear exam reveals no significant lesions or deformities. Canals clear .TMs normal. Hearing is grossly normal bilaterally. Nose: External nasal exam reveals no deformity or inflammation. Nasal mucosa are pink and moist. No lesions or exudates noted.  Mouth: Oral mucosa and oropharynx reveal no lesions or exudates. Teeth in good repair. Neck: No deformities, masses, or tenderness noted. Range of motion & Thyroid normal. Lungs: Normal respiratory effort; chest expands symmetrically. Lungs are clear to auscultation without rales, wheezes, or increased work of breathing. Heart: Normal rate and rhythm. Normal S1 and  S2. No gallop, click, or rub. No murmur. Abdomen: Bowel sounds normal; abdomen soft and nontender. No masses, organomegaly or hernias noted. Genitalia/ DRE:   Genital exam is unremarkable. Prostate is upper limits of normal without asymmetry, nodularity, or induration .                                                                                   Musculoskeletal/extremities: No deformity or scoliosis noted of  the thoracic or lumbar spine. No clubbing, cyanosis, edema, or deformity noted. Range of motion  normal .Tone & strength  normal.Joints normal. Nail health  good. Straight leg raising is negative bilaterally. Vascular: Carotid, radial artery, dorsalis pedis and  posterior tibial pulses are full and equal. No bruits present. Neurologic: Alert and oriented x3. Deep tendon reflexes symmetrical and normal  . He is able to walk on his heels and toes without difficulty.       Skin: Intact without suspicious lesions or rashes. Lymph: No cervical, axillary, or inguinal lymphadenopathy present. Psych: Mood and affect are normal. Normally interactive  Assessment & Plan:  #1 comprehensive physical exam; no acute findings  #2 recent low back pain; no neurovascular deficit present  #3 cardiovascular risks discussed #2 see Problem List with Assessments & Recommendations Plan: see Orders

## 2011-11-29 ENCOUNTER — Telehealth: Payer: Self-pay | Admitting: Internal Medicine

## 2011-11-29 NOTE — Telephone Encounter (Signed)
I reviewed the chart; I see no shots for hepatitis C. This would have to be  diagnosed by blood studies, Hepatitis Screen. If those were positive he would be referred to the hepatitis C clinic which is run by [facility] here in [place].

## 2011-11-29 NOTE — Telephone Encounter (Signed)
Left a message for patient to return call.

## 2011-11-29 NOTE — Telephone Encounter (Signed)
See notes

## 2011-11-29 NOTE — Telephone Encounter (Signed)
Patient calling, states he has done research on the Hep C virus, and is questioning why Dr. Alwyn Ren is giving him shots, since his blood has not been tested for this yet.

## 2011-11-29 NOTE — Telephone Encounter (Signed)
Pt states he heard that baby boomers should get tested for hepatitis C and when he left md told his nurse to give him a shot and then come back for another one?

## 2011-11-29 NOTE — Telephone Encounter (Signed)
Please advise 

## 2011-12-03 NOTE — Telephone Encounter (Signed)
I am sorry that confusion; he is right hepatitis C screening is indicated. Apparently I thought he was asking for the 3  hepatitis B series of shots.

## 2011-12-04 NOTE — Telephone Encounter (Signed)
Pt would like to get the screening for the hepatitis C pt will have to call back to set up lab appt.  Please place order for hepatitis C screening.

## 2011-12-04 NOTE — Telephone Encounter (Signed)
Hepatitis C serology; code hepatitis screening

## 2011-12-05 ENCOUNTER — Other Ambulatory Visit: Payer: 59

## 2011-12-05 ENCOUNTER — Other Ambulatory Visit (INDEPENDENT_AMBULATORY_CARE_PROVIDER_SITE_OTHER): Payer: 59

## 2011-12-05 DIAGNOSIS — Z1159 Encounter for screening for other viral diseases: Secondary | ICD-10-CM

## 2011-12-05 NOTE — Telephone Encounter (Signed)
Patient has pending lab appointment tomorrow

## 2013-01-09 ENCOUNTER — Encounter: Payer: 59 | Admitting: Internal Medicine

## 2013-04-08 ENCOUNTER — Encounter: Payer: Self-pay | Admitting: Internal Medicine

## 2013-04-08 ENCOUNTER — Ambulatory Visit (INDEPENDENT_AMBULATORY_CARE_PROVIDER_SITE_OTHER): Payer: 59 | Admitting: Internal Medicine

## 2013-04-08 VITALS — BP 112/64 | HR 55 | Temp 97.7°F | Resp 12 | Ht 67.08 in | Wt 138.0 lb

## 2013-04-08 DIAGNOSIS — N429 Disorder of prostate, unspecified: Secondary | ICD-10-CM

## 2013-04-08 DIAGNOSIS — Z2911 Encounter for prophylactic immunotherapy for respiratory syncytial virus (RSV): Secondary | ICD-10-CM

## 2013-04-08 DIAGNOSIS — Z0000 Encounter for general adult medical examination without abnormal findings: Secondary | ICD-10-CM

## 2013-04-08 DIAGNOSIS — Z23 Encounter for immunization: Secondary | ICD-10-CM

## 2013-04-08 LAB — LIPID PANEL
Cholesterol: 179 mg/dL (ref 0–200)
LDL Cholesterol: 104 mg/dL — ABNORMAL HIGH (ref 0–99)
Triglycerides: 43 mg/dL (ref 0.0–149.0)

## 2013-04-08 LAB — BASIC METABOLIC PANEL
BUN: 9 mg/dL (ref 6–23)
CO2: 28 mEq/L (ref 19–32)
Chloride: 107 mEq/L (ref 96–112)
Glucose, Bld: 69 mg/dL — ABNORMAL LOW (ref 70–99)
Potassium: 4.3 mEq/L (ref 3.5–5.1)

## 2013-04-08 LAB — CBC WITH DIFFERENTIAL/PLATELET
Eosinophils Absolute: 0.1 10*3/uL (ref 0.0–0.7)
Eosinophils Relative: 3.6 % (ref 0.0–5.0)
HCT: 38.1 % — ABNORMAL LOW (ref 39.0–52.0)
Lymphs Abs: 1.2 10*3/uL (ref 0.7–4.0)
MCHC: 33.1 g/dL (ref 30.0–36.0)
MCV: 90.7 fl (ref 78.0–100.0)
Monocytes Absolute: 0.4 10*3/uL (ref 0.1–1.0)
Platelets: 182 10*3/uL (ref 150.0–400.0)
WBC: 4 10*3/uL — ABNORMAL LOW (ref 4.5–10.5)

## 2013-04-08 LAB — HEPATIC FUNCTION PANEL
ALT: 16 U/L (ref 0–53)
Total Bilirubin: 0.7 mg/dL (ref 0.3–1.2)
Total Protein: 7.2 g/dL (ref 6.0–8.3)

## 2013-04-08 NOTE — Progress Notes (Signed)
  Subjective:    Patient ID: Mathew Stafford, male    DOB: [birth year], 67 y.o.   MRN: [id number]  HPI  [name]  is here for a physical; he denies acute issues .     Review of Systems He is on a heart healthy diet; he exercises 60 minutes 5 times per week without symptoms. Specifically he denies chest pain, palpitations, dyspnea, or claudication. Family history is negative for premature coronary disease. Advanced cholesterol testing reveals his LDL goal is less than 100.  He does smoke 5 cigarettes per day; he denies significant dyspnea as noted. He also denies wheezing, sputum production, or hemoptysis.  He has no fever, chills, sweats, or unexplained weight loss  He has had adenomatous polyps in the past. Colonoscopy is up-to-date. He denies melena or rectal bleeding.     Objective:   Physical Exam Gen.: Thin but healthy and well-nourished in appearance. Alert, appropriate and cooperative throughout exam.Appears younger than stated age  Head: Normocephalic without obvious abnormalities; no alopecia  Eyes: No corneal or conjunctival inflammation noted. Pupils equal round reactive to light and accommodation. Fundal exam is benign without hemorrhages, exudate, papilledema. Extraocular motion intact. Vision grossly normal with lenses Ears: External  ear exam reveals no significant lesions or deformities. Canals clear .TMs normal. Hearing is grossly normal bilaterally. Nose: External nasal exam reveals no deformity or inflammation. Nasal mucosa are pink and moist. No lesions or exudates noted.   Mouth: Oral mucosa and oropharynx reveal no lesions or exudates. Teeth in good repair.Osteomata of mandible Neck: No deformities, masses, or tenderness noted. Range of motion & Thyroid normal. Lungs: Normal respiratory effort; chest expands symmetrically. Lungs are clear to auscultation without rales, wheezes, or increased work of breathing. Heart: Slow rate ;regular rhythm. Normal S1 and S2. No gallop,  click, or rub. S4 w/o murmur. Abdomen: Bowel sounds normal; abdomen soft and nontender. No masses, organomegaly or hernias noted. Genitalia: Genitalia normal . Left lobe of Prostate is normal. Right lobe minimally enlarged w/o nodularity;slight induration.                            Musculoskeletal/extremities: No deformity or scoliosis noted of  the thoracic or lumbar spine.  No clubbing, cyanosis, edema, or significant extremity  deformity noted. Range of motion normal .Tone & strength  Normal. Joints normal. Nail health good. Able to lie down & sit up w/o help. Negative SLR bilaterally Vascular: Carotid, radial artery, dorsalis pedis and  posterior tibial pulses are full and equal. No bruits present. Neurologic: Alert and oriented x3. Deep tendon reflexes symmetrical and normal.          Skin: Intact without suspicious lesions or rashes. Lymph: No cervical, axillary, or inguinal lymphadenopathy present. Psych: Mood and affect are normal. Normally interactive                                                                                        Assessment & Plan:  #1 comprehensive physical exam; no acute findings  Plan: see Orders  & Recommendations

## 2013-04-08 NOTE — Patient Instructions (Addendum)
  Eat a low-fat diet with lots of fruits and vegetables, up to 7-9 servings per day. This would eliminate the need for vitamin supplements. Consume less than 40 grams of sugar (preferably ZERO) per day from foods & drinks with High Fructose Corn Sugar as #1,2,3 or # 4 on label. Health Care Power of Attorney & Living Will. Complete these if not in place ; these place you in charge of your health care decisions.  If you activate the  My Chart system; lab & Xray results will be released directly  to you as soon as I review & address these through the computer. If you choose not to sign up for My Chart within 36 hours of labs being drawn; results will be reviewed & interpretation added before being copied & mailed, causing a delay in getting the results to you.If you do not receive that report within 7-10 days ,please call. Additionally you can use this system to gain direct  access to your records  if  out of town or @ an office of a  physician who is not in  the My Chart network.  This improves continuity of care & places you in control of your medical record.

## 2013-04-16 ENCOUNTER — Telehealth: Payer: Self-pay | Admitting: Internal Medicine

## 2013-04-16 DIAGNOSIS — D649 Anemia, unspecified: Secondary | ICD-10-CM

## 2013-04-16 NOTE — Telephone Encounter (Signed)
Patient received letter with recent lab results. Results state mild anemia is present. Patient would like to know what he can do about this. Call back at work # first, if no answer call home #.

## 2013-04-16 NOTE — Telephone Encounter (Signed)
This is been a chronic finding over the last 6 years. In the past B12 and iron levels have been normal. To be cautious these could be repeated along with completing stool cards.  Send stool cards & schedule repeat CBC with iron panel, B12  levels(Code: 285.9).

## 2013-04-16 NOTE — Telephone Encounter (Signed)
Discuss with patient, labs ordered and appt scheduled.

## 2013-04-17 ENCOUNTER — Other Ambulatory Visit (INDEPENDENT_AMBULATORY_CARE_PROVIDER_SITE_OTHER): Payer: 59

## 2013-04-17 DIAGNOSIS — D649 Anemia, unspecified: Secondary | ICD-10-CM

## 2013-04-17 LAB — IBC PANEL
Iron: 98 ug/dL (ref 42–165)
Saturation Ratios: 37 % (ref 20.0–50.0)

## 2013-04-17 LAB — CBC
RDW: 13.5 % (ref 11.5–14.6)
WBC: 3.9 10*3/uL — ABNORMAL LOW (ref 4.5–10.5)

## 2013-04-17 LAB — VITAMIN B12: Vitamin B-12: 689 pg/mL (ref 211–911)

## 2013-05-13 ENCOUNTER — Ambulatory Visit (INDEPENDENT_AMBULATORY_CARE_PROVIDER_SITE_OTHER): Payer: 59 | Admitting: *Deleted

## 2013-05-13 DIAGNOSIS — Z23 Encounter for immunization: Secondary | ICD-10-CM

## 2013-05-27 ENCOUNTER — Other Ambulatory Visit (INDEPENDENT_AMBULATORY_CARE_PROVIDER_SITE_OTHER): Payer: 59

## 2013-05-27 DIAGNOSIS — Z1289 Encounter for screening for malignant neoplasm of other sites: Secondary | ICD-10-CM

## 2013-05-27 DIAGNOSIS — Z0000 Encounter for general adult medical examination without abnormal findings: Secondary | ICD-10-CM

## 2013-05-27 LAB — HEMOCCULT GUIAC POC 1CARD (OFFICE)
Card #2 Fecal Occult Blod, POC: NEGATIVE
Fecal Occult Blood, POC: NEGATIVE

## 2013-09-10 IMAGING — CR DG CHEST 2V
2 series · 2 of 2 positions shown · non-contrast
Comparison: 08/23/10

CLINICAL DATA: Smoker

CHEST - 2 VIEW

[view not recorded (1 of 2)]
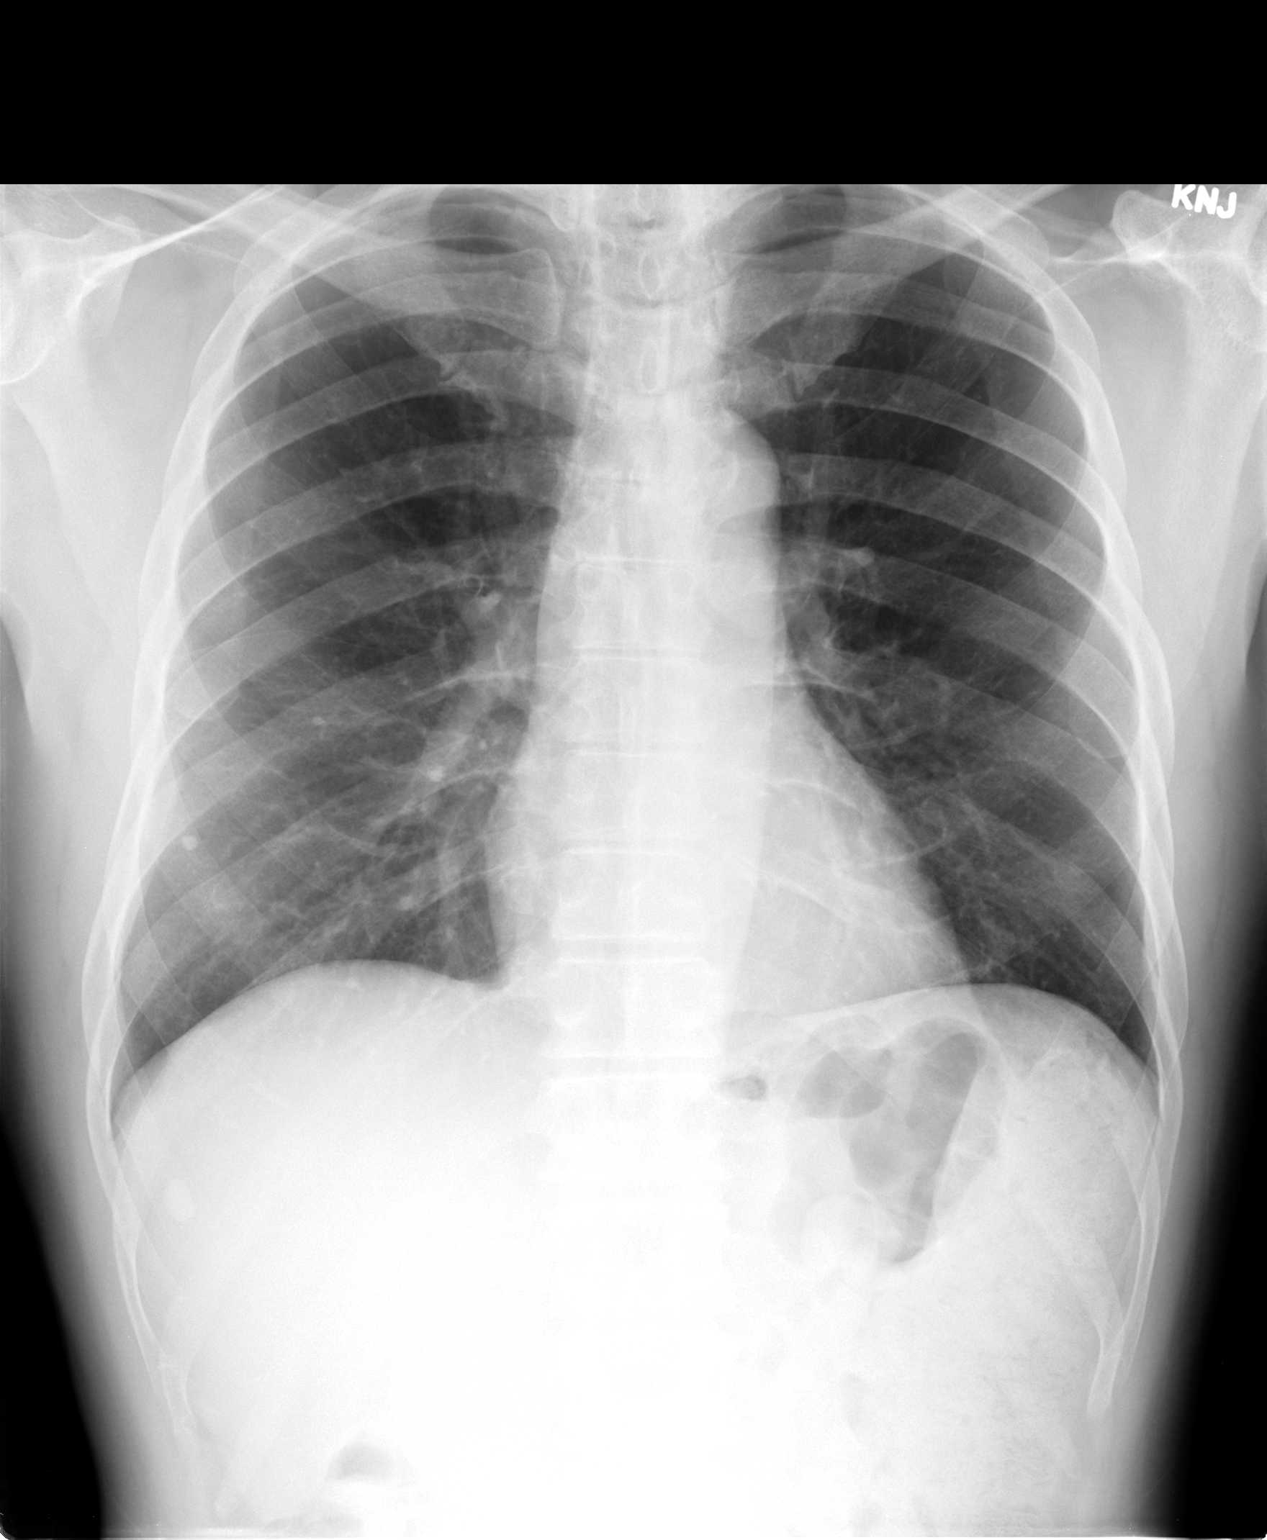

[view not recorded (2 of 2)]
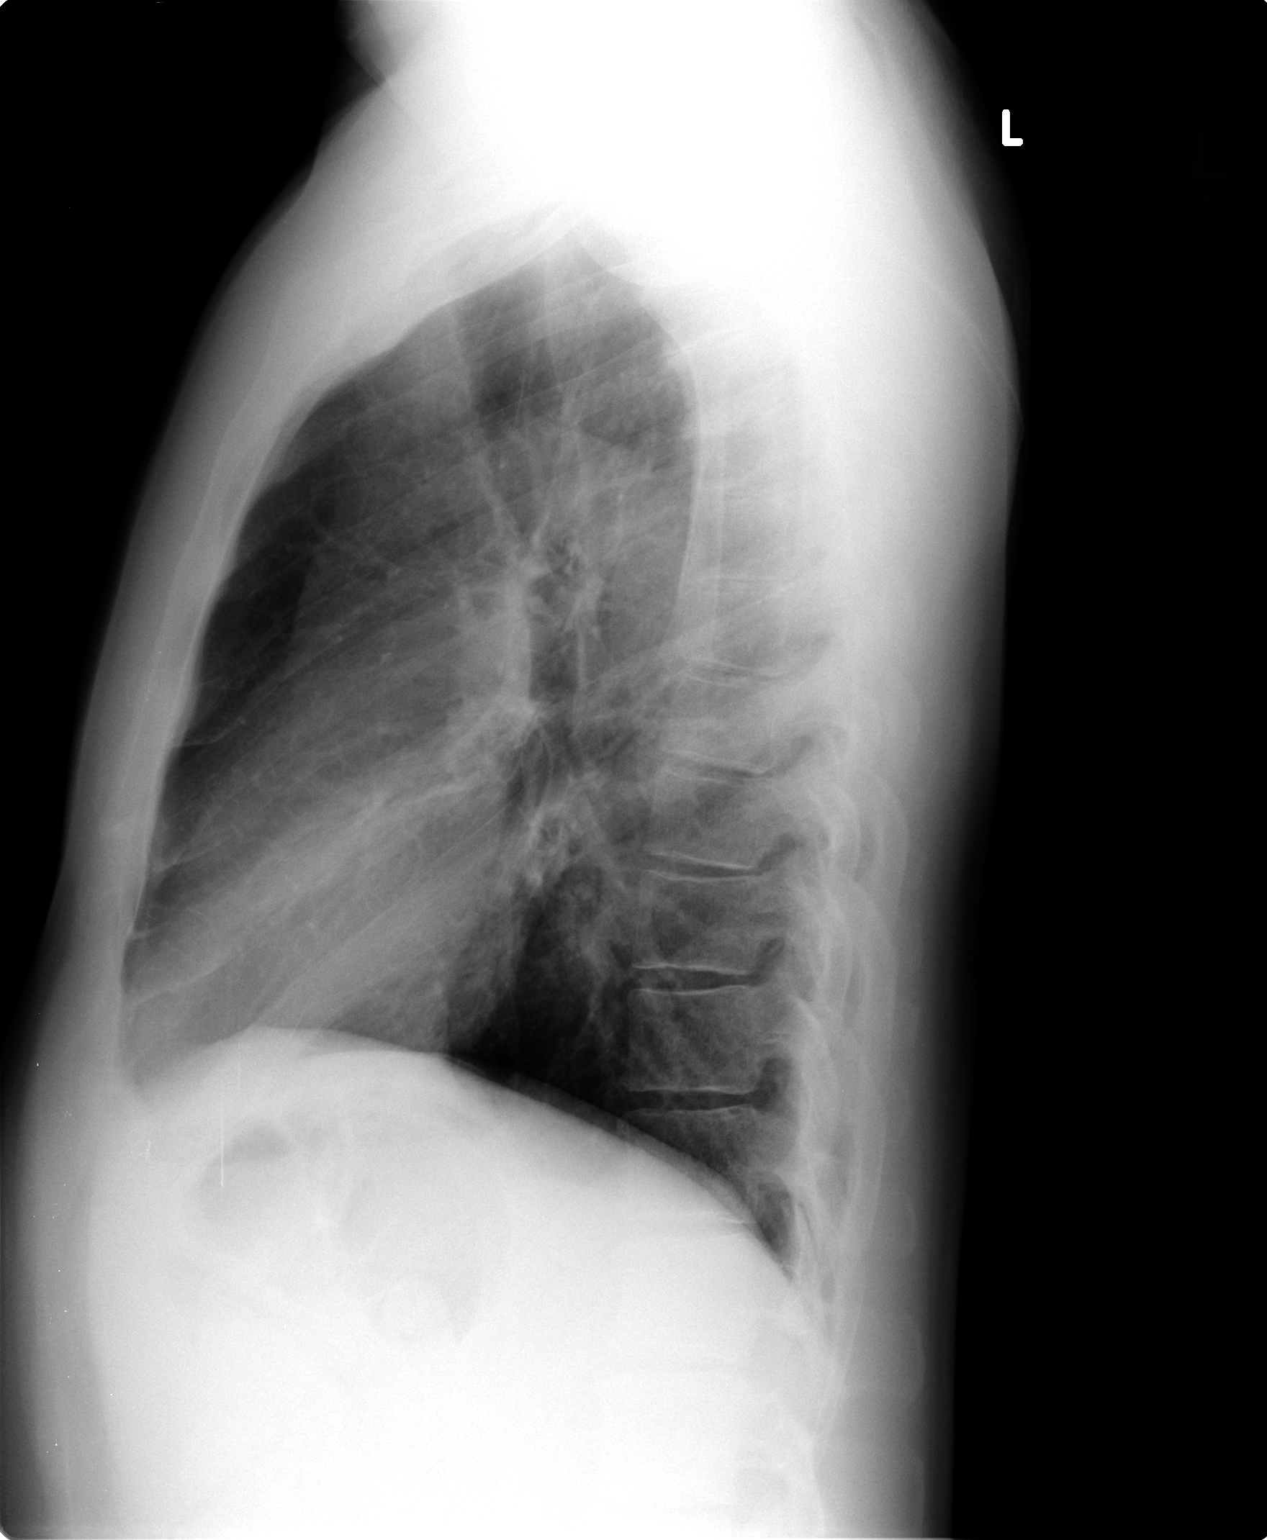

[2 of 2 positions shown; findings below may reference images not displayed]

FINDINGS: Cardiomediastinal silhouette is stable.  No acute
infiltrate or pleural effusion.  No pulmonary edema.  Small
calcified granulomas in the right lower lobe are stable.
IMPRESSION: No active disease.  No significant change.

## 2013-10-01 ENCOUNTER — Other Ambulatory Visit: Payer: Self-pay

## 2013-10-19 ENCOUNTER — Encounter: Payer: Self-pay | Admitting: Internal Medicine

## 2013-11-25 ENCOUNTER — Telehealth: Payer: Self-pay

## 2013-11-25 NOTE — Telephone Encounter (Signed)
Left message for call back Non identifiable  Had CPE 03/2013--does he need another this soon?

## 2013-11-27 NOTE — Telephone Encounter (Signed)
Patient is checking with his insurance company.

## 2013-11-27 NOTE — Telephone Encounter (Signed)
Patient states his insurance will cover the CPE. Medication List and allergies:  Reviewed and updated  90 day supply/mail order: na Local prescriptions: [facility]  Immunizations due: UTD  A/P:   No changes to [place] or PSH or personal hx Flu vaccine--08/2013 PNA--04/2013 Shingles--03/2013 Tdap--2009 CCS--09/2009--Dr Kaplan--next due 2020 PSA--07/2010--0.96  To Discuss with Provider: Bump on L shoulder x several years

## 2013-11-30 ENCOUNTER — Encounter: Payer: Self-pay | Admitting: Internal Medicine

## 2013-11-30 ENCOUNTER — Ambulatory Visit (INDEPENDENT_AMBULATORY_CARE_PROVIDER_SITE_OTHER): Payer: 59 | Admitting: Internal Medicine

## 2013-11-30 ENCOUNTER — Ambulatory Visit (HOSPITAL_BASED_OUTPATIENT_CLINIC_OR_DEPARTMENT_OTHER)
Admission: RE | Admit: 2013-11-30 | Discharge: 2013-11-30 | Disposition: A | Discharge status: Home / Self Care | Payer: 59 | Source: Ambulatory Visit | Attending: Internal Medicine | Admitting: Internal Medicine

## 2013-11-30 VITALS — BP 120/72 | HR 58 | Temp 98.2°F | Resp 13 | Wt 143.0 lb

## 2013-11-30 DIAGNOSIS — Z860100 Personal history of colon polyps, unspecified: Secondary | ICD-10-CM

## 2013-11-30 DIAGNOSIS — R930 Abnormal findings on diagnostic imaging of skull and head, not elsewhere classified: Secondary | ICD-10-CM

## 2013-11-30 DIAGNOSIS — Z87891 Personal history of nicotine dependence: Secondary | ICD-10-CM | POA: Insufficient documentation

## 2013-11-30 DIAGNOSIS — E785 Hyperlipidemia, unspecified: Secondary | ICD-10-CM

## 2013-11-30 DIAGNOSIS — J8410 Pulmonary fibrosis, unspecified: Secondary | ICD-10-CM | POA: Insufficient documentation

## 2013-11-30 DIAGNOSIS — Z0000 Encounter for general adult medical examination without abnormal findings: Secondary | ICD-10-CM

## 2013-11-30 LAB — CBC WITH DIFFERENTIAL/PLATELET
BASOS PCT: 0.4 % (ref 0.0–3.0)
Basophils Absolute: 0 10*3/uL (ref 0.0–0.1)
EOS ABS: 0.1 10*3/uL (ref 0.0–0.7)
EOS PCT: 3.5 % (ref 0.0–5.0)
HCT: 40.3 % (ref 39.0–52.0)
HEMOGLOBIN: 13.1 g/dL (ref 13.0–17.0)
Lymphocytes Relative: 27.7 % (ref 12.0–46.0)
Lymphs Abs: 1.1 10*3/uL (ref 0.7–4.0)
MCHC: 32.6 g/dL (ref 30.0–36.0)
MCV: 91.1 fl (ref 78.0–100.0)
MONO ABS: 0.4 10*3/uL (ref 0.1–1.0)
Monocytes Relative: 10.9 % (ref 3.0–12.0)
NEUTROS ABS: 2.3 10*3/uL (ref 1.4–7.7)
NEUTROS PCT: 57.5 % (ref 43.0–77.0)
Platelets: 174 10*3/uL (ref 150.0–400.0)
RBC: 4.42 Mil/uL (ref 4.22–5.81)
RDW: 14.1 % (ref 11.5–14.6)
WBC: 4 10*3/uL — AB (ref 4.5–10.5)

## 2013-11-30 LAB — BASIC METABOLIC PANEL
BUN: 17 mg/dL (ref 6–23)
CHLORIDE: 103 meq/L (ref 96–112)
CO2: 28 meq/L (ref 19–32)
CREATININE: 1.1 mg/dL (ref 0.4–1.5)
Calcium: 9.4 mg/dL (ref 8.4–10.5)
GFR: 87.99 mL/min (ref >60.00)
Glucose, Bld: 75 mg/dL (ref 70–99)
POTASSIUM: 4.3 meq/L (ref 3.5–5.1)
SODIUM: 138 meq/L (ref 135–145)

## 2013-11-30 LAB — LIPID PANEL
CHOL/HDL RATIO: 3
Cholesterol: 182 mg/dL (ref 0–200)
HDL: 59.7 mg/dL (ref >39.00)
LDL Cholesterol: 113 mg/dL — ABNORMAL HIGH (ref 0–99)
TRIGLYCERIDES: 47 mg/dL (ref 0.0–149.0)
VLDL: 9.4 mg/dL (ref 0.0–40.0)

## 2013-11-30 LAB — HEPATIC FUNCTION PANEL
ALBUMIN: 4.2 g/dL (ref 3.5–5.2)
ALK PHOS: 50 U/L (ref 39–117)
ALT: 22 U/L (ref 0–53)
AST: 26 U/L (ref 0–37)
BILIRUBIN DIRECT: 0.1 mg/dL (ref 0.0–0.3)
TOTAL PROTEIN: 7.6 g/dL (ref 6.0–8.3)
Total Bilirubin: 0.6 mg/dL (ref 0.3–1.2)

## 2013-11-30 LAB — TSH: TSH: 1.5 u[IU]/mL (ref 0.35–5.50)

## 2013-11-30 LAB — PSA: PSA: 1.49 ng/mL (ref 0.10–4.00)

## 2013-11-30 NOTE — Patient Instructions (Signed)
Your next office appointment will be determined based upon review of your pending labs & x-rays. Those instructions will be transmitted to you through My Chart  or by mail if you're not using this system.  Order for x-rays entered into  the computer; these will be performed at [facility]. No appointment is necessary.

## 2013-11-30 NOTE — Progress Notes (Signed)
   Subjective:    Patient ID: Mathew Stafford, male    DOB: 04-Oct-1946, 68 y.o.   MRN: [id number]  HPI   He is here for a physical;acute issues denied.     Review of Systems A heart healthy diet is followed; exercise encompasses 30 minutes 5  times per week as swimming & machines without symptoms.  Family history is negative for premature coronary disease. Advanced cholesterol testing reveals  LDL goal is less than 100 ; ideally < 70 . There is medication compliance with the statin.  Low dose ASA taken Specifically denied are  chest pain, palpitations, dyspnea, or claudication.  Significant abdominal symptoms, memory deficit, or myalgias not present.     Objective:   Physical Exam Gen.: Thin but healthy and well-nourished in appearance. Alert, appropriate and cooperative throughout exam.Appears younger than stated age  Head: Normocephalic without obvious abnormalities; no alopecia  Eyes: No corneal or conjunctival inflammation noted. Pupils equal round reactive to light and accommodation. Extraocular motion intact. Arcus senilis Ears: External  ear exam reveals no significant lesions or deformities. Canals clear .TMs normal. Hearing is grossly normal bilaterally. Nose: External nasal exam reveals no deformity or inflammation. Nasal mucosa are pink and moist. No lesions or exudates noted.   Mouth: Oral mucosa and oropharynx reveal no lesions or exudates. Teeth in good repair. Neck: No deformities, masses, or tenderness noted. Range of motion slightly decreased. Thyroid normal. Lungs: Normal respiratory effort; chest expands symmetrically. Lungs are clear to auscultation without rales, wheezes, or increased work of breathing. Heart: Normal rate and rhythm. Normal S1 and S2. No gallop, click, or rub. S4 w/o murmur. Abdomen: Bowel sounds normal; abdomen soft and nontender. No masses, organomegaly or hernias noted. Genitalia: Genitalia normal except for small left varices. Prostate is  asymmetrically enlarged, R > L with slight irregular contour.No induration                                  Musculoskeletal/extremities: No deformity or scoliosis noted of  the thoracic or lumbar spine.  No  cyanosis, edema, or significant extremity  deformity noted. Range of motion normal .Tone & strength normal.Slight clubbing suggested. Minor L knee crepitus Hand joints normal . Fingernail  health good. Able to lie down & sit up w/o help. Negative SLR bilaterally Vascular: Carotid, radial artery, dorsalis pedis and  posterior tibial pulses are full and equal. No bruits present. Neurologic: Alert and oriented x3. Deep tendon reflexes symmetrical and normal.        Skin: Intact without suspicious lesions or rashes.2.5 X 2.5 cm epidermoid inclusion cyst L upper back Lymph: No cervical, axillary, or inguinal lymphadenopathy present. Psych: Mood and affect are normal. Normally interactive                                                                                        Assessment & Plan:  #1 comprehensive physical exam; no acute findings  Plan: see Orders  & Recommendations

## 2013-12-16 ENCOUNTER — Encounter: Payer: Self-pay | Admitting: Internal Medicine

## 2014-03-05 ENCOUNTER — Encounter: Payer: Self-pay | Admitting: Internal Medicine

## 2014-04-12 ENCOUNTER — Encounter: Payer: Self-pay | Admitting: Internal Medicine

## 2014-04-13 ENCOUNTER — Ambulatory Visit (INDEPENDENT_AMBULATORY_CARE_PROVIDER_SITE_OTHER): Payer: 59 | Admitting: Internal Medicine

## 2014-04-13 ENCOUNTER — Ambulatory Visit (INDEPENDENT_AMBULATORY_CARE_PROVIDER_SITE_OTHER): Payer: 59 | Admitting: Surgery

## 2014-04-13 ENCOUNTER — Encounter: Payer: Self-pay | Admitting: Internal Medicine

## 2014-04-13 ENCOUNTER — Telehealth: Payer: Self-pay | Admitting: Internal Medicine

## 2014-04-13 ENCOUNTER — Other Ambulatory Visit: Payer: 59

## 2014-04-13 ENCOUNTER — Encounter (INDEPENDENT_AMBULATORY_CARE_PROVIDER_SITE_OTHER): Payer: Self-pay | Admitting: Surgery

## 2014-04-13 VITALS — BP 124/86 | HR 60 | Temp 98.0°F | Resp 13 | Wt 144.0 lb

## 2014-04-13 VITALS — BP 126/80 | HR 62 | Temp 97.8°F | Ht 68.0 in | Wt 142.0 lb

## 2014-04-13 DIAGNOSIS — L02414 Cutaneous abscess of left upper limb: Secondary | ICD-10-CM

## 2014-04-13 DIAGNOSIS — IMO0002 Reserved for concepts with insufficient information to code with codable children: Secondary | ICD-10-CM

## 2014-04-13 DIAGNOSIS — L089 Local infection of the skin and subcutaneous tissue, unspecified: Secondary | ICD-10-CM

## 2014-04-13 DIAGNOSIS — L723 Sebaceous cyst: Secondary | ICD-10-CM

## 2014-04-13 MED ORDER — TRAMADOL HCL 50 MG PO TABS: 50.0000 mg | ORAL_TABLET | Freq: Four times a day (QID) | ORAL | Status: DC | PRN

## 2014-04-13 MED ORDER — AMOXICILLIN-POT CLAVULANATE 875-125 MG PO TABS: 1.0000 | ORAL_TABLET | Freq: Two times a day (BID) | ORAL | Status: DC

## 2014-04-13 MED ORDER — HYDROCODONE-ACETAMINOPHEN 5-325 MG PO TABS: 1.0000 | ORAL_TABLET | Freq: Four times a day (QID) | ORAL | Status: DC | PRN

## 2014-04-13 NOTE — Patient Instructions (Signed)
Dip gauze in  sterile saline and applied to the wound two-three times a day. Cover the wound with Telfa , non stick dressing  without any antibiotic ointment. The saline can be purchased at the drugstore or you can make your own .Boil cup of salt in a gallon of water. Store mixture  in a clean container.Report Warning  signs as discussed (red streaks, pus, fever, increasing pain).

## 2014-04-13 NOTE — Telephone Encounter (Signed)
Relevant patient education assigned to patient using Emmi. ° °

## 2014-04-13 NOTE — Progress Notes (Signed)
   Subjective:    Patient ID: Mathew Stafford, male    DOB: 06-01-1946, 68 y.o.   MRN: [id number]  HPI   He has had a "lump" over the left shoulder for 2-3 years. As of 04/05/14 he noted that it was becoming redder and increasing in size. There is also associated itching and tenderness.  He has used calamine lotion on it without any response  The initial appearance was of blistering associated with tingling pain. It has become frankly purulent and spread.  He did take the shingles vaccine. He has no history of MRSA    Review of Systems  He specifically denies fever, chills, weight loss        Objective:   Physical Exam   He appears healthy and well-nourished; he is thin.He is in no distress  He has no lymphadenopathy about the neck or axilla. There is a 45 x 45 mm area of cellulitis above the left shoulder. centrally there is a 30 x 25 mm irregular abscess collection with multiple draining sites. The drainage is thick, yellow, and purulent. There is insignificant bleeding.  The abscess was cleaned with Betadine and alcohol. Culture was collected.        Assessment & Plan:  #1 abscess left shoulder area; this probably represents an infected epidermoid inclusion cyst  See orders and after visit summary

## 2014-04-13 NOTE — Addendum Note (Signed)
Addended by: Roma Schanz R on: 04/13/2014 12:19 PM   Modules accepted: Orders

## 2014-04-13 NOTE — Patient Instructions (Signed)
Shower daily.  Change dressing daily. Take antibiotics until gone. Return 7- 10 days.

## 2014-04-13 NOTE — Progress Notes (Signed)
Pre visit review using our clinic review tool, if applicable. No additional management support is needed unless otherwise documented below in the visit note. 

## 2014-04-14 ENCOUNTER — Encounter (INDEPENDENT_AMBULATORY_CARE_PROVIDER_SITE_OTHER): Payer: Self-pay | Admitting: Surgery

## 2014-04-14 ENCOUNTER — Telehealth (INDEPENDENT_AMBULATORY_CARE_PROVIDER_SITE_OTHER): Payer: Self-pay

## 2014-04-14 MED ORDER — DOXYCYCLINE HYCLATE 50 MG PO CAPS: 100.0000 mg | ORAL_CAPSULE | Freq: Two times a day (BID) | ORAL | Status: DC

## 2014-04-14 NOTE — Telephone Encounter (Signed)
Pt was given Augmentin by Dr Linna Darner and then doxy yesterday in urge. Is Augmentin ok to take since he has already picked it up from pharmacy or switch to the doxy?

## 2014-04-14 NOTE — Telephone Encounter (Signed)
augmentin is fine

## 2014-04-14 NOTE — Telephone Encounter (Signed)
LMOM> Dr Brantley Stage ok with him taking his Augmentin. Does not need to get the doxy.

## 2014-04-14 NOTE — Progress Notes (Signed)
Patient ID: [name], male   DOB: 1946/09/27, 68 y.o.   MRN: [id number]  Chief Complaint  Patient presents with  . abscess left shoulder    HPI Mathew Stafford is a 68 y.o. male.  Pt sent at the request of Dr Deliah Boston for infected epidermal inclusion cyst left shoulder.  Pt reports 3 day hx of swelling,  Pain and redness.  Has cyst there for many years.  HPI  Past Medical History  Diagnosis Date  . Anemia 2006    H&H 12.7/37.7 ; normal B12, folate, iron levels  . Hyperlipidemia     < 100  . Renal calculi     X 1; [place]  . Adenomatous colon polyp     X 2;Dr Collene Mares    Past Surgical History  Procedure Laterality Date  . Colonoscopy w/ polypectomy  2002& 2006    Dr Collene Mares  . Inguinal hernia repair    . Colonoscopy  2011    negative ; [facility]    Family History  Problem Relation Age of Onset  . Arthritis Mother     OA  . Hypertension Mother   . Hypertension Maternal Aunt   . Diabetes Maternal Uncle   . Heart disease Maternal Grandmother     PACER  . Stroke Neg Hx   . Heart attack Neg Hx     Social History History  Substance Use Topics  . Smoking status: Current Every Day Smoker -- 0.25 packs/day    Types: Cigarettes  . Smokeless tobacco: Not on file     Comment: smoked age 45-present , up to 1 ppd ; now 1/4 ppd  . Alcohol Use: 8.4 oz/week    14 Glasses of wine per week    No Known Allergies  Current Outpatient Prescriptions  Medication Sig Dispense Refill  . aspirin 81 MG tablet Take 81 mg by mouth daily.        . Multiple Vitamin (MULTIVITAMIN) tablet Take 1 tablet by mouth daily.        . Omega-3 Fatty Acids (FISH OIL) 1000 MG CAPS Take by mouth 3 (three) times daily.        . Probiotic Product (PROBIOTIC DAILY PO) Take by mouth daily.      . Wheat Dextrin (BENEFIBER PO) Take by mouth.        Marland Kitchen HYDROcodone-acetaminophen (NORCO) 5-325 MG per tablet Take 1-2 tablets by mouth every 6 (six) hours as needed for moderate pain.  20 tablet  0  . traMADol  (ULTRAM) 50 MG tablet Take 1 tablet (50 mg total) by mouth every 6 (six) hours as needed.  30 tablet  0   No current facility-administered medications for this visit.    Review of Systems Review of Systems  Constitutional: Negative for fever and fatigue.  Respiratory: Negative.   Cardiovascular: Negative.   Gastrointestinal: Negative.     Blood pressure 126/80, pulse 62, temperature 97.8 F (36.6 C), height 5\' 8"  (1.727 m), weight 142 lb (64.411 kg).  Physical Exam Physical Exam  Constitutional: He is oriented to person, place, and time. He appears well-developed and well-nourished.  Pulmonary/Chest: Effort normal.  Musculoskeletal: Normal range of motion.  Neurological: He is alert and oriented to person, place, and time.  Skin:     Psychiatric: He has a normal mood and affect. His behavior is normal. Judgment and thought content normal.    Data Reviewed NONE  Assessment    Left shoulder infected epidermal inclusion cyst  Plan    Recommend incision and drainage in office.  Procedure discussed with the patient. Risks,  Benefits and alternatives discussed. Complications discussed. He agrees to proceed. Left shoulder region prepped in a sterile fashion.  1 % lidocaine with epinephrine used for local. Cyst excised with scalpel and packed. Hemostasis achieved. Wound instructions given. Follow up next week.  norco 5/325 # 20 1 every 4 hours as needed for pain.  Doxycycline 100 mg po BID        Teretha Chalupa A. Christos Mixson 04/14/2014, 7:37 AM

## 2014-04-14 NOTE — Telephone Encounter (Signed)
Relayed information below

## 2014-04-16 LAB — WOUND CULTURE
GRAM STAIN: NONE SEEN
Organism ID, Bacteria: NO GROWTH

## 2014-04-23 ENCOUNTER — Ambulatory Visit (INDEPENDENT_AMBULATORY_CARE_PROVIDER_SITE_OTHER): Payer: 59 | Admitting: Surgery

## 2014-04-23 ENCOUNTER — Encounter (INDEPENDENT_AMBULATORY_CARE_PROVIDER_SITE_OTHER): Payer: Self-pay | Admitting: Surgery

## 2014-04-23 VITALS — BP 128/80 | HR 78 | Temp 97.6°F | Resp 16 | Ht 68.0 in | Wt 142.6 lb

## 2014-04-23 DIAGNOSIS — L723 Sebaceous cyst: Secondary | ICD-10-CM

## 2014-04-23 DIAGNOSIS — L089 Local infection of the skin and subcutaneous tissue, unspecified: Secondary | ICD-10-CM

## 2014-04-23 NOTE — Patient Instructions (Signed)

## 2014-04-23 NOTE — Progress Notes (Signed)
Patient ID: Mathew Stafford, male   DOB: Aug 04, 1946, 68 y.o.   MRN: [id number]  Chief Complaint  Patient presents with  . Follow-up    rechk abscess on left shoulder    HPI Mathew Stafford is a 68 y.o. male.  Pt sent at the request of Dr Deliah Boston for infected epidermal inclusion cyst left shoulder.  Pt reports 3 day hx of swelling,  Pain and redness.  Has cyst there for many years. Pt had it drained and he is doing well. No more pain.  HPI  Past Medical History  Diagnosis Date  . Anemia 2006    H&H 12.7/37.7 ; normal B12, folate, iron levels  . Hyperlipidemia     < 100  . Renal calculi     X 1; [place]  . Adenomatous colon polyp     X 2;Dr Collene Mares    Past Surgical History  Procedure Laterality Date  . Colonoscopy w/ polypectomy  2002& 2006    Dr Collene Mares  . Inguinal hernia repair    . Colonoscopy  2011    negative ; [facility]    Family History  Problem Relation Age of Onset  . Arthritis Mother     OA  . Hypertension Mother   . Hypertension Maternal Aunt   . Diabetes Maternal Uncle   . Heart disease Maternal Grandmother     PACER  . Stroke Neg Hx   . Heart attack Neg Hx     Social History History  Substance Use Topics  . Smoking status: Current Every Day Smoker -- 0.25 packs/day    Types: Cigarettes  . Smokeless tobacco: Not on file     Comment: smoked age 26-present , up to 1 ppd ; now 1/4 ppd  . Alcohol Use: 8.4 oz/week    14 Glasses of wine per week    No Known Allergies  Current Outpatient Prescriptions  Medication Sig Dispense Refill  . aspirin 81 MG tablet Take 81 mg by mouth daily.        . Multiple Vitamin (MULTIVITAMIN) tablet Take 1 tablet by mouth daily.        . Omega-3 Fatty Acids (FISH OIL) 1000 MG CAPS Take by mouth 3 (three) times daily.        . Probiotic Product (PROBIOTIC DAILY PO) Take by mouth daily.      . Wheat Dextrin (BENEFIBER PO) Take by mouth.        . traMADol (ULTRAM) 50 MG tablet Take 1 tablet (50 mg total) by mouth every 6 (six)  hours as needed.  30 tablet  0   No current facility-administered medications for this visit.    Review of Systems Review of Systems  Constitutional: Negative for fever and fatigue.  Respiratory: Negative.   Cardiovascular: Negative.   Gastrointestinal: Negative.     Blood pressure 128/80, pulse 78, temperature 97.6 F (36.4 C), temperature source Temporal, resp. rate 16, height 5\' 8"  (1.727 m), weight 142 lb 9.6 oz (64.683 kg).  Physical Exam Physical Exam  Constitutional: He is oriented to person, place, and time. He appears well-developed and well-nourished.  Pulmonary/Chest: Effort normal.  Musculoskeletal: Normal range of motion.  Neurological: He is alert and oriented to person, place, and time.  Skin:    4 cm left shoulder cyst well drained.  No redness or fluctuance Psychiatric: He has a normal mood and affect. His behavior is normal. Judgment and thought content normal.    Data Reviewed NONE  Assessment  Left shoulder  epidermal inclusion cyst    Plan    Recommend excision of left shoulder cyst 4 cm The procedure has been discussed with the patient.  Alternative therapies have been discussed with the patient.  Operative risks include bleeding,  Infection,  Organ injury,  Nerve injury,  Blood vessel injury,  DVT,  Pulmonary embolism,  Death,  And possible reoperation.  Medical management risks include worsening of present situation.  The success of the procedure is 50 -90 % at treating patients symptoms.  The patient understands and agrees to proceed.   Jaidy Cottam A. Necha Harries 04/23/2014, 12:42 PM

## 2014-09-01 ENCOUNTER — Encounter: Payer: Self-pay | Admitting: Gastroenterology

## 2014-09-10 ENCOUNTER — Other Ambulatory Visit: Payer: Self-pay

## 2014-11-04 ENCOUNTER — Encounter: Payer: Self-pay | Admitting: Internal Medicine

## 2014-11-04 ENCOUNTER — Ambulatory Visit (INDEPENDENT_AMBULATORY_CARE_PROVIDER_SITE_OTHER): Payer: 59 | Admitting: Internal Medicine

## 2014-11-04 ENCOUNTER — Other Ambulatory Visit: Payer: 59

## 2014-11-04 VITALS — BP 130/68 | HR 61 | Temp 97.5°F | Wt 143.0 lb

## 2014-11-04 DIAGNOSIS — Z202 Contact with and (suspected) exposure to infections with a predominantly sexual mode of transmission: Secondary | ICD-10-CM

## 2014-11-04 LAB — RPR

## 2014-11-04 LAB — HIV ANTIBODY (ROUTINE TESTING W REFLEX): HIV 1&2 Ab, 4th Generation: NONREACTIVE

## 2014-11-04 NOTE — Progress Notes (Signed)
   Subjective:    Patient ID: Mathew Stafford, male    DOB: 1946-01-27, 68 y.o.   MRN: [id number]  HPI   On 07/30/14 he had unprotected sex  with an individual who now tells him she that she is HIV antibody positive.  He has no constitutional or genitourinary symptoms.    Review of Systems   He specifically denies fever, chills, sweats, weight loss.  He's had no rash or lesions particularly in the genitourinary area.  He denies dysuria, pyuria, hematuria.  He's had no iritis symptoms. He denies any intraoral lesions.     Objective:   Physical Exam   He is thin but appears healthy and well-nourished Eyes: No conjunctival inflammation or scleral icterus is present.  Oral exam: Dental hygiene is good. Lips and gums are healthy appearing.There is no oropharyngeal erythema or exudate noted.   Heart:  Normal rate and regular rhythm. S1 and S2 normal without gallop, murmur, click, rub or other extra sounds     Lungs:Chest clear to auscultation; no wheezes, rhonchi,rales ,or rubs present.No increased work of breathing.   Abdomen: bowel sounds normal, soft and non-tender without masses, organomegaly or hernias noted.  No guarding or rebound.   Vascular : all pulses equal ; no bruits present.  Skin:Warm & dry.  Intact without suspicious lesions or rashes ; no tenting  Lymphatic: No lymphadenopathy is noted about the head, neck, axilla, or inguinal areas.   GU: varicoele on L              Assessment & Plan:  #1 possible STD exposure See orders

## 2014-11-04 NOTE — Patient Instructions (Signed)
Your next office appointment will be determined based upon review of your pending labs . Those instructions will be transmitted to you through My Chart  Please report any significant symptoms as we reviewed.

## 2014-11-04 NOTE — Progress Notes (Signed)
Pre visit review using our clinic review tool, if applicable. No additional management support is needed unless otherwise documented below in the visit note. 

## 2014-11-08 LAB — HSV 2 ANTIBODY, IGG: HSV 2 GLYCOPROTEIN G AB, IGG: 10.72 IV — AB

## 2015-02-07 ENCOUNTER — Telehealth: Payer: Self-pay

## 2015-02-07 NOTE — Telephone Encounter (Signed)
Call to patient and stated he had flu shot at his employer in Oct, 2015.

## 2015-05-23 ENCOUNTER — Other Ambulatory Visit: Payer: Self-pay

## 2015-11-14 ENCOUNTER — Encounter: Payer: Self-pay | Admitting: Internal Medicine

## 2015-11-14 ENCOUNTER — Telehealth: Payer: Self-pay

## 2015-11-14 ENCOUNTER — Ambulatory Visit (INDEPENDENT_AMBULATORY_CARE_PROVIDER_SITE_OTHER): Payer: 59 | Admitting: Internal Medicine

## 2015-11-14 ENCOUNTER — Other Ambulatory Visit (INDEPENDENT_AMBULATORY_CARE_PROVIDER_SITE_OTHER): Payer: 59

## 2015-11-14 VITALS — BP 118/78 | HR 54 | Temp 98.3°F | Resp 20 | Ht 68.0 in | Wt 141.5 lb

## 2015-11-14 DIAGNOSIS — E785 Hyperlipidemia, unspecified: Secondary | ICD-10-CM | POA: Diagnosis not present

## 2015-11-14 DIAGNOSIS — Z86010 Personal history of colonic polyps: Secondary | ICD-10-CM

## 2015-11-14 DIAGNOSIS — Z862 Personal history of diseases of the blood and blood-forming organs and certain disorders involving the immune mechanism: Secondary | ICD-10-CM

## 2015-11-14 DIAGNOSIS — N429 Disorder of prostate, unspecified: Secondary | ICD-10-CM

## 2015-11-14 LAB — BASIC METABOLIC PANEL
BUN: 17 mg/dL (ref 6–23)
CALCIUM: 9.6 mg/dL (ref 8.4–10.5)
CO2: 28 meq/L (ref 19–32)
Chloride: 107 mEq/L (ref 96–112)
Creatinine, Ser: 1 mg/dL (ref 0.40–1.50)
GFR: 95.6 mL/min (ref >60.00)
Glucose, Bld: 84 mg/dL (ref 70–99)
Potassium: 4.3 mEq/L (ref 3.5–5.1)
SODIUM: 142 meq/L (ref 135–145)

## 2015-11-14 LAB — LIPID PANEL
Cholesterol: 190 mg/dL (ref 0–200)
HDL: 59.2 mg/dL (ref >39.00)
LDL CALC: 118 mg/dL — AB (ref 0–99)
NONHDL: 130.95
Total CHOL/HDL Ratio: 3
Triglycerides: 63 mg/dL (ref 0.0–149.0)
VLDL: 12.6 mg/dL (ref 0.0–40.0)

## 2015-11-14 LAB — CBC WITH DIFFERENTIAL/PLATELET
BASOS PCT: 0.4 % (ref 0.0–3.0)
Basophils Absolute: 0 10*3/uL (ref 0.0–0.1)
EOS PCT: 2.5 % (ref 0.0–5.0)
Eosinophils Absolute: 0.1 10*3/uL (ref 0.0–0.7)
HCT: 41.1 % (ref 39.0–52.0)
HEMOGLOBIN: 13.5 g/dL (ref 13.0–17.0)
LYMPHS ABS: 1.3 10*3/uL (ref 0.7–4.0)
Lymphocytes Relative: 26.9 % (ref 12.0–46.0)
MCHC: 32.7 g/dL (ref 30.0–36.0)
MCV: 90.8 fl (ref 78.0–100.0)
MONO ABS: 0.5 10*3/uL (ref 0.1–1.0)
MONOS PCT: 10 % (ref 3.0–12.0)
NEUTROS PCT: 60.2 % (ref 43.0–77.0)
Neutro Abs: 2.9 10*3/uL (ref 1.4–7.7)
Platelets: 191 10*3/uL (ref 150.0–400.0)
RBC: 4.53 Mil/uL (ref 4.22–5.81)
RDW: 13.7 % (ref 11.5–15.5)
WBC: 4.8 10*3/uL (ref 4.0–10.5)

## 2015-11-14 LAB — HEPATIC FUNCTION PANEL
ALBUMIN: 4.3 g/dL (ref 3.5–5.2)
ALK PHOS: 51 U/L (ref 39–117)
ALT: 15 U/L (ref 0–53)
AST: 21 U/L (ref 0–37)
BILIRUBIN TOTAL: 0.6 mg/dL (ref 0.2–1.2)
Bilirubin, Direct: 0.1 mg/dL (ref 0.0–0.3)
Total Protein: 7.5 g/dL (ref 6.0–8.3)

## 2015-11-14 LAB — PSA: PSA: 0.86 ng/mL (ref 0.10–4.00)

## 2015-11-14 LAB — TSH: TSH: 0.78 u[IU]/mL (ref 0.35–4.50)

## 2015-11-14 NOTE — Assessment & Plan Note (Signed)
CBC

## 2015-11-14 NOTE — Assessment & Plan Note (Signed)
Lipids, LFTs, TSH  

## 2015-11-14 NOTE — Telephone Encounter (Signed)
Call to fup on AWV; Stated that he can come in on Monday at lunch at he works; the office will be closed the next 2 Monday's due to the holiday and will schedule for 1/9 at 11:30;.

## 2015-11-14 NOTE — Patient Instructions (Signed)
  Your next office appointment will be determined based upon review of your pending labs  and  xrays  Those written interpretation of the lab results and instructions will be transmitted to you by My Chart   Critical results will be called.   Followup as needed for any active or acute issue. Please report any significant change in your symptoms. 

## 2015-11-14 NOTE — Progress Notes (Signed)
Pre visit review using our clinic review tool, if applicable. No additional management support is needed unless otherwise documented below in the visit note. 

## 2015-11-14 NOTE — Progress Notes (Signed)
   Subjective:    Patient ID: Mathew Stafford, male    DOB: 07-22-1946, 69 y.o.   MRN: [id number]  HPI The patient is here to assess status of active health conditions.  PMH, FH, & Social History reviewed & updated.No change in [place] as recorded.  He is on no prescription medicines. He follows a heart healthy diet. He swims for 30 minutes 5 times a week without cardio pulmonary symptoms  Colonoscopy is up-to-date; he has no active GI symptoms.  He will have 2 glasses of wine per day. He smokes 3 cigarettes per day.  Review of systems is negative except for nocturia 1. Review of Systems  Chest pain, palpitations, tachycardia, exertional dyspnea, paroxysmal nocturnal dyspnea, claudication or edema are absent. No unexplained weight loss, abdominal pain, significant dyspepsia, dysphagia, melena, rectal bleeding, or persistently small caliber stools. Dysuria, pyuria, hematuria, frequency,  or polyuria are denied. Change in hair, skin, nails denied. No bowel changes of constipation or diarrhea. No intolerance to heat or cold.     Objective:   Physical Exam  Pertinent or positive findings include: He has a goatee. He has arcus senilis. There is minor crepitus of the knees. The prostate is asymmetric; the left lobe is small. The right lobe is normal but slightly indurated. There is no nodularity present.  General appearance :adequately nourished; in no distress.  Eyes: No conjunctival inflammation or scleral icterus is present.  Oral exam:  Lips and gums are healthy appearing.There is no oropharyngeal erythema or exudate noted. Dental hygiene is good.  Heart:  Normal rate and regular rhythm. S1 and S2 normal without gallop, murmur, click, rub or other extra sounds    Lungs:Chest clear to auscultation; no wheezes, rhonchi,rales ,or rubs present.No increased work of breathing.   Abdomen: bowel sounds normal, soft and non-tender without masses, organomegaly or hernias noted.  No guarding  or rebound.   Vascular : all pulses equal ; no bruits present.  Skin:Warm & dry.  Intact without suspicious lesions or rashes ; no tenting .  Lymphatic: No lymphadenopathy is noted about the head, neck, axilla, or inguinal areas.   Neuro: Strength, tone & DTRs normal.     Assessment & Plan:  #1 See Current Assessment & Plan in Problem List under specific Diagnosis  #2 asymmetric prostate  See orders and recommendations

## 2015-12-05 ENCOUNTER — Ambulatory Visit: Payer: 59

## 2015-12-05 ENCOUNTER — Ambulatory Visit (INDEPENDENT_AMBULATORY_CARE_PROVIDER_SITE_OTHER): Payer: 59

## 2015-12-05 VITALS — BP 126/80 | Temp 98.3°F | Ht 68.0 in | Wt 139.5 lb

## 2015-12-05 DIAGNOSIS — Z0000 Encounter for general adult medical examination without abnormal findings: Secondary | ICD-10-CM | POA: Diagnosis not present

## 2015-12-05 DIAGNOSIS — Z23 Encounter for immunization: Secondary | ICD-10-CM | POA: Diagnosis not present

## 2015-12-05 NOTE — Patient Instructions (Addendum)
Mathew Stafford , Thank you for taking time to come for your Medicare Wellness Visit. I appreciate your ongoing commitment to your health goals. Please review the following plan we discussed and let me know if I can assist you in the future.   Will take the prevnar today  Keep swimming!  These are the goals we discussed: Goals    . patient      No goals; likes to relax and maintain;        This is a list of the screening recommended for you and due dates:  Health Maintenance  Topic Date Due  . Pneumonia vaccines (2 of 2 - PCV13) 05/13/2014  . Flu Shot  06/26/2016  . Tetanus Vaccine  07/15/2018  . Colon Cancer Screening  10/11/2019  . Shingles Vaccine  Completed  .  Hepatitis C: One time screening is recommended by Center for Disease Control  (CDC) for  adults born from [birth year] through 1965.   Completed     Health Maintenance, Male A healthy lifestyle and preventative care can promote health and wellness.  Maintain regular health, dental, and eye exams.  Eat a healthy diet. Foods like vegetables, fruits, whole grains, low-fat dairy products, and lean protein foods contain the nutrients you need and are low in calories. Decrease your intake of foods high in solid fats, added sugars, and salt. Get information about a proper diet from your health care provider, if necessary.  Regular physical exercise is one of the most important things you can do for your health. Most adults should get at least 150 minutes of moderate-intensity exercise (any activity that increases your heart rate and causes you to sweat) each week. In addition, most adults need muscle-strengthening exercises on 2 or more days a week.   Maintain a healthy weight. The body mass index (BMI) is a screening tool to identify possible weight problems. It provides an estimate of body fat based on height and weight. Your health care provider can find your BMI and can help you achieve or maintain a healthy weight. For males 20  years and older:  A BMI below 18.5 is considered underweight.  A BMI of 18.5 to 24.9 is normal.  A BMI of 25 to 29.9 is considered overweight.  A BMI of 30 and above is considered obese.  Maintain normal blood lipids and cholesterol by exercising and minimizing your intake of saturated fat. Eat a balanced diet with plenty of fruits and vegetables. Blood tests for lipids and cholesterol should begin at age 36 and be repeated every 5 years. If your lipid or cholesterol levels are high, you are over age 38, or you are at high risk for heart disease, you may need your cholesterol levels checked more frequently.Ongoing high lipid and cholesterol levels should be treated with medicines if diet and exercise are not working.  If you smoke, find out from your health care provider how to quit. If you do not use tobacco, do not start.  Lung cancer screening is recommended for adults aged 4-80 years who are at high risk for developing lung cancer because of a history of smoking. A yearly low-dose CT scan of the lungs is recommended for people who have at least a 30-pack-year history of smoking and are current smokers or have quit within the past 15 years. A pack year of smoking is smoking an average of 1 pack of cigarettes a day for 1 year (for example, a 30-pack-year history of smoking could mean smoking  1 pack a day for 30 years or 2 packs a day for 15 years). Yearly screening should continue until the smoker has stopped smoking for at least 15 years. Yearly screening should be stopped for people who develop a health problem that would prevent them from having lung cancer treatment.  If you choose to drink alcohol, do not have more than 2 drinks per day. One drink is considered to be 12 oz (360 mL) of beer, 5 oz (150 mL) of wine, or 1.5 oz (45 mL) of liquor.  Avoid the use of street drugs. Do not share needles with anyone. Ask for help if you need support or instructions about stopping the use of  drugs.  High blood pressure causes heart disease and increases the risk of stroke. High blood pressure is more likely to develop in:  People who have blood pressure in the end of the normal range (100-139/85-89 mm Hg).  People who are overweight or obese.  People who are African American.  If you are 47-73 years of age, have your blood pressure checked every 3-5 years. If you are 89 years of age or older, have your blood pressure checked every year. You should have your blood pressure measured twice--once when you are at a hospital or clinic, and once when you are not at a hospital or clinic. Record the average of the two measurements. To check your blood pressure when you are not at a hospital or clinic, you can use:  An automated blood pressure machine at a pharmacy.  A home blood pressure monitor.  If you are 44-61 years old, ask your health care provider if you should take aspirin to prevent heart disease.  Diabetes screening involves taking a blood sample to check your fasting blood sugar level. This should be done once every 3 years after age 73 if you are at a normal weight and without risk factors for diabetes. Testing should be considered at a younger age or be carried out more frequently if you are overweight and have at least 1 risk factor for diabetes.  Colorectal cancer can be detected and often prevented. Most routine colorectal cancer screening begins at the age of 52 and continues through age 34. However, your health care provider may recommend screening at an earlier age if you have risk factors for colon cancer. On a yearly basis, your health care provider may provide home test kits to check for hidden blood in the stool. A small camera at the end of a tube may be used to directly examine the colon (sigmoidoscopy or colonoscopy) to detect the earliest forms of colorectal cancer. Talk to your health care provider about this at age 30 when routine screening begins. A direct exam  of the colon should be repeated every 5-10 years through age 14, unless early forms of precancerous polyps or small growths are found.  People who are at an increased risk for hepatitis B should be screened for this virus. You are considered at high risk for hepatitis B if:  You were born in a country where hepatitis B occurs often. Talk with your health care provider about which countries are considered high risk.  Your parents were born in a high-risk country and you have not received a shot to protect against hepatitis B (hepatitis B vaccine).  You have HIV or AIDS.  You use needles to inject street drugs.  You live with, or have sex with, someone who has hepatitis B.  You are a  man who has sex with other men (MSM).  You get hemodialysis treatment.  You take certain medicines for conditions like cancer, organ transplantation, and autoimmune conditions.  Hepatitis C blood testing is recommended for all people born from [birth year] through 1965 and any individual with known risk factors for hepatitis C.  Healthy men should no longer receive prostate-specific antigen (PSA) blood tests as part of routine cancer screening. Talk to your health care provider about prostate cancer screening.  Testicular cancer screening is not recommended for adolescents or adult males who have no symptoms. Screening includes self-exam, a health care provider exam, and other screening tests. Consult with your health care provider about any symptoms you have or any concerns you have about testicular cancer.  Practice safe sex. Use condoms and avoid high-risk sexual practices to reduce the spread of sexually transmitted infections (STIs).  You should be screened for STIs, including gonorrhea and chlamydia if:  You are sexually active and are younger than 24 years.  You are older than 24 years, and your health care provider tells you that you are at risk for this type of infection.  Your sexual activity has  changed since you were last screened, and you are at an increased risk for chlamydia or gonorrhea. Ask your health care provider if you are at risk.  If you are at risk of being infected with HIV, it is recommended that you take a prescription medicine daily to prevent HIV infection. This is called pre-exposure prophylaxis (PrEP). You are considered at risk if:  You are a man who has sex with other men (MSM).  You are a heterosexual man who is sexually active with multiple partners.  You take drugs by injection.  You are sexually active with a partner who has HIV.  Talk with your health care provider about whether you are at high risk of being infected with HIV. If you choose to begin PrEP, you should first be tested for HIV. You should then be tested every 3 months for as long as you are taking PrEP.  Use sunscreen. Apply sunscreen liberally and repeatedly throughout the day. You should seek shade when your shadow is shorter than you. Protect yourself by wearing long sleeves, pants, a wide-brimmed hat, and sunglasses year round whenever you are outdoors.  Tell your health care provider of new moles or changes in moles, especially if there is a change in shape or color. Also, tell your health care provider if a mole is larger than the size of a pencil eraser.  A one-time screening for abdominal aortic aneurysm (AAA) and surgical repair of large AAAs by ultrasound is recommended for men aged 105-75 years who are current or former smokers.  Stay current with your vaccines (immunizations).   This information is not intended to replace advice given to you by your health care provider. Make sure you discuss any questions you have with your health care provider.   Document Released: 05/10/2008 Document Revised: 12/03/2014 Document Reviewed: 04/09/2011 Elsevier Interactive Patient Education [employer].

## 2015-12-05 NOTE — Progress Notes (Signed)
Subjective:   Mathew Stafford is a 70 y.o. male who presents for an Initial Medicare Annual Wellness Visit.  Review of Systems  HRA assessment completed during visit; Mathew Stafford, Mathew Stafford The Patient was informed that this wellness visit is to identify risk and educate on how to reduce risk for increase disease through lifestyle changes.   ROS deferred to CPE exam with physician which was in December;  Generally comes in annually Mathew Stafford; hx of MI;  Mathew Stafford has DM: OA  Mathew Stafford passed Pneumonia;  One Mathew Stafford and 2 Mathew Stafford in [place] d/o/ limited to 3 cigarettes; sometimes after lunch or with glass of wine at hs.Enjoys and does not smoke add'l cigarettes  Hyperlipidemia 12/19 lipids Cho 190; trig 63; HDL 59; LDL 118;  Glucose 94  Medical issues / none; stays healthy  BMI: 21.1 / Weight is 139; lost some weight  Diet; Mathew Stafford now living with him is diabetic; (70yo)  she has lived with him x 3 years; was living in [place] Breakfast oatmeal; coffee and juice;  Lunch; sardines; tuna fish;  Supper; Order from cafeteria; for him and his Mathew Stafford;  Snacking; have piece of candy of fruit; don't eat many sweets;  Glass of wine at hs   Exercise; Swims on lunch hour; 30 min a day x 4 to 5 days; friends at work go as well   SAFETY; one level home; bought the home thinking about her Has help for care giving his Mathew Stafford; Aid  5 days a week for 4 hours a day and she is alone 1 to 6pm Mathew Stafford is adjusting; PCP has rx hospital bed; PT recommended following a fall which was thought to be due to the height of the mattress Has lifeline;   Safety reviewed for the home; including removal of clutter, keeping paths clear;  railing as needed; bathroom was just remodeled; bathtub was taken out and now has a roll in shower; community safety reviewed; smoke detectors and firearms safety reviewed as ewll  Driving accidents no; wears seatbelts Sun protection; yes Stressors; 1 managing caregiving;  Sometimes  caregivers doesn't show and they help her getting dressed and this gets stressful at times. Does have a few issues; as doesn't want to take her medicine etc.   Medication review/no issues  Fall assessment / no  Gait assessment/ get up and go is normal   Mobilization and Functional losses in the last year./ no Sleep patterns/ good; 7 hours   Urinary or fecal incontinence reviewed with no issues voiced    Counseling: Colonoscopy; 09/2009 2020 year fup  EKG: 03/2013 Hearing: 2000hz  on right; 4000hz   Ophthalmology exam; generally annually; skipped 2016; [employer]; will schedule apt.  Immunizations Due: PCV 13  PSV was in 04/2013; NKA   Current Care Team reviewed and updated  Cardiac Risk Factors include: advanced age (>40men, >36 women);dyslipidemia;smoking/ tobacco exposure    Objective:    Today's Vitals   12/05/15 1513  BP: 126/80  Height: 5\' 8"  (1.727 m)  Weight: 139 lb 8 oz (63.277 kg)    Current Medications (verified) Outpatient Encounter Prescriptions as of 12/05/2015  Medication Sig  . aspirin 81 MG tablet Take 81 mg by mouth daily.    . Multiple Vitamin (MULTIVITAMIN) tablet Take 1 tablet by mouth daily.    . Omega-3 Fatty Acids (FISH OIL) 1000 MG CAPS Take by mouth 3 (three) times daily.    . Probiotic Product (PROBIOTIC DAILY PO) Take by mouth daily.  . vitamin  C (ASCORBIC ACID) 500 MG tablet Take 500 mg by mouth daily.  . Wheat Dextrin (BENEFIBER DRINK MIX PO) Take by mouth.   No facility-administered encounter medications on file as of 12/05/2015.    Allergies (verified) Review of patient's allergies indicates no known allergies.   History: Past Medical History  Diagnosis Date  . Anemia 2006    H&H 12.7/37.7 ; normal B12, folate, iron levels  . Hyperlipidemia     < 100  . Renal calculi     X 1; [place] [facility]  . Adenomatous colon polyp     X 2;Dr Collene Mares   Past Surgical History  Procedure Laterality Date  . Colonoscopy w/ polypectomy  2002& 2006    Dr Collene Mares   . Inguinal hernia repair    . Colonoscopy  2011    negative ; [facility]   Family History  Problem Relation Age of Onset  . Arthritis Mathew Stafford     OA  . Hypertension Mathew Stafford   . Hypertension Maternal Aunt   . Diabetes Maternal Uncle   . Heart disease Maternal Grandmother     PACER  . Stroke Neg Hx   . Heart attack Neg Hx    Social History   Occupational History  . Not on file.   Social History Main Topics  . Smoking status: Current Every Day Smoker -- 0.25 packs/day    Types: Cigarettes  . Smokeless tobacco: Not on file     Comment: smoked age 89-present , up to 1 ppd ; 11/14/15 3 cigarettes a day  . Alcohol Use: 8.4 oz/week    14 Glasses of wine per week     Comment: 2 glasses   . Drug Use: No  . Sexual Activity: Not on file   Tobacco Counseling Ready to quit: Not Answered Counseling given: Yes   Activities of Daily Living In your present state of health, do you have any difficulty performing the following activities: 12/05/2015  Hearing? N  Vision? N  Difficulty concentrating or making decisions? N  Walking or climbing stairs? N  Dressing or bathing? N  Doing errands, shopping? N  Preparing Food and eating ? N  Using the Toilet? N  In the past six months, have you accidently leaked urine? N  Do you have problems with loss of bowel control? N  Managing your Medications? N  Managing your Finances? N  Housekeeping or managing your Housekeeping? N    Immunizations and Health Maintenance Immunization History  Administered Date(s) Administered  . Hepatitis B 11/02/2011  . Influenza Whole 08/21/2010, 08/26/2013  . Influenza-Unspecified 08/26/2014, 08/27/2015  . Pneumococcal Conjugate-13 12/05/2015  . Pneumococcal Polysaccharide-23 05/13/2013  . Td 07/15/2008  . Zoster 04/08/2013   Health Maintenance Due  Topic Date Due  . PNA vac Low Risk Adult (2 of 2 - PCV13) 05/13/2014    Patient Care Team: Hendricks Limes, MD as PCP - General  Indicate any recent  Medical Services you may have received from other than [facility] providers in the past year (date may be approximate).    Assessment:   This is a routine wellness examination for Mathew Stafford.   Assessment   Patient presents for yearly preventative medicine examination. Medicare questionnaire screening were completed, i.e. Functional; fall risk; depression, memory loss and hearing were all unremarkable   All immunizations and health maintenance protocols were reviewed with the patient and agreed to take prevnar today  Education provided for laboratory screens;  Labs were good  Medication reconciliation, past medical  history, social history, problem list and allergies were reviewed in detail with the patient  Goals were established with regard to continued exercise   End of life planning was discussed. Stated he did not have a HCPOA and discussed what this is. The patient agreed to more information. Advanced Directive;  Focused face to face x  20 minutes discussing HCPOA and Living will and reviewed all the questions in the [place] forms. The patient voices understanding of HCPOA; LW reviewed and information provided on each question. Educated on how to revoke this HCPOA or LW at any time.   Also  discussed life prolonging measures (given a few examples) and where he could choose to initiate or not;  the ability to give the HCPOA power to change his living will or not if he cannot speak for himself; as well as finalizing the will by 2 unrelated witnesses and notary.  Will call for questions and given information on [place] for further assistance  Spiritual Care Department at [phone], or the Clinical Social Work Department at [phone].  Asked if he had questions and he did not; discussed the difference in this will and administrative will.   Hearing/Vision screen  Hearing Screening   125Hz  250Hz  500Hz  1000Hz  2000Hz  4000Hz  8000Hz   Right ear:     100    Left ear:       100     Dietary issues and exercise activities discussed: Current Exercise Habits:: Structured exercise class, Time (Minutes): 35, Frequency (Times/Week): 4, Weekly Exercise (Minutes/Week): 140, Intensity: Moderate  Goals    . patient      No goals; likes to relax and maintain;       Depression Screen PHQ 2/9 Scores 12/05/2015 11/14/2015 11/30/2013 04/08/2013  PHQ - 2 Score 0 0 0 0    Fall Risk Fall Risk  12/05/2015 11/14/2015 11/30/2013 04/08/2013  Falls in the past year? No No Yes No    Cognitive Function: No flowsheet data found.   Ad8 Score is 0; but did discuss the early stages of Alzheimers' and did discuss some changes he had seen in his other; as having to buy her a new phone and she had difficulty learning to use it. Did discuss community assistance for long term care; but also gave him information on Adult enrichment centers and a packet of information from the [place] regarding services and long term care facilities which he has thought about for the future.   Screening Tests Health Maintenance  Topic Date Due  . PNA vac Low Risk Adult (2 of 2 - PCV13) 05/13/2014  . INFLUENZA VACCINE  06/26/2016  . TETANUS/TDAP  07/15/2018  . COLONOSCOPY  10/11/2019  . ZOSTAVAX  Completed  . Hepatitis C Screening  Completed        Plan:   Took Prevnar today Will schedule eye exam early next year.  No new issues ; will maintain health and continue swimming at lunch  During the course of the visit [place] was educated and counseled about the following appropriate screening and preventive services:   Vaccines to include Pneumoccal, Influenza, Hepatitis B, Td, Zostavax, HCV/   Electrocardiogram/ 03/2013  Colorectal cancer screening/ due 09/2019  Cardiovascular disease screening/ neg; bP good; BMI good  Diabetes screening/ neg  Glaucoma screening; to be scheduled  Nutrition counseling/ eats omega 3's; balanced dinner at hs from cafeteria that does not season food a lot     Prostate cancer screening/completed   Smoking cessation counseling/  smokes 2 to 3 cigarettes a day; Does not want to quit at present; never smokes more; has smoked a few cigarettes a day for a long time.   Patient Instructions (the written plan) were given to the patient.   Wynetta Fines, RN   12/05/2015

## 2015-12-05 NOTE — Progress Notes (Signed)
Subjective

## 2015-12-05 NOTE — Progress Notes (Signed)
Medical screening examination/treatment/procedure(s) were performed by non-physician practitioner and as supervising physician I was immediately available for consultation/collaboration.  I agree with above. Alesia Oshields J Ima Hafner, MD  

## 2016-06-11 ENCOUNTER — Ambulatory Visit (INDEPENDENT_AMBULATORY_CARE_PROVIDER_SITE_OTHER): Payer: 59 | Admitting: Internal Medicine

## 2016-06-11 ENCOUNTER — Encounter: Payer: Self-pay | Admitting: Internal Medicine

## 2016-06-11 VITALS — BP 120/72 | HR 78 | Temp 98.6°F | Ht 68.0 in | Wt 133.8 lb

## 2016-06-11 DIAGNOSIS — K4091 Unilateral inguinal hernia, without obstruction or gangrene, recurrent: Secondary | ICD-10-CM

## 2016-06-11 DIAGNOSIS — L729 Follicular cyst of the skin and subcutaneous tissue, unspecified: Secondary | ICD-10-CM | POA: Insufficient documentation

## 2016-06-11 NOTE — Patient Instructions (Signed)
A referral was ordered for surgery.   Inguinal Hernia, Adult Muscles help keep everything in the body in its proper place. But if a weak spot in the muscles develops, something can poke through. That is called a hernia. When this happens in the lower part of the belly (abdomen), it is called an inguinal hernia. (It takes its name from a part of the body in this region called the inguinal canal.) A weak spot in the wall of muscles lets some fat or part of the small intestine bulge through. An inguinal hernia can develop at any age. Men get them more often than women. CAUSES  In adults, an inguinal hernia develops over time.  It can be triggered by:  Suddenly straining the muscles of the lower abdomen.  Lifting heavy objects.  Straining to have a bowel movement. Difficult bowel movements (constipation) can lead to this.  Constant coughing. This may be caused by smoking or lung disease.  Being overweight.  Being pregnant.  Working at a job that requires long periods of standing or heavy lifting.  Having had an inguinal hernia before. One type can be an emergency situation. It is called a strangulated inguinal hernia. It develops if part of the small intestine slips through the weak spot and cannot get back into the abdomen. The blood supply can be cut off. If that happens, part of the intestine may die. This situation requires emergency surgery. SYMPTOMS  Often, a small inguinal hernia has no symptoms. It is found when a healthcare provider does a physical exam. Larger hernias usually have symptoms.   In adults, symptoms may include:  A lump in the groin. This is easier to see when the person is standing. It might disappear when lying down.  In men, a lump in the scrotum.  Pain or burning in the groin. This occurs especially when lifting, straining or coughing.  A dull ache or feeling of pressure in the groin.  Signs of a strangulated hernia can include:  A bulge in the groin  that becomes very painful and tender to the touch.  A bulge that turns red or purple.  Fever, nausea and vomiting.  Inability to have a bowel movement or to pass gas. DIAGNOSIS  To decide if you have an inguinal hernia, a healthcare provider will probably do a physical examination.  This will include asking questions about any symptoms you have noticed.  The healthcare provider might feel the groin area and ask you to cough. If an inguinal hernia is felt, the healthcare provider may try to slide it back into the abdomen.  Usually no other tests are needed. TREATMENT  Treatments can vary. The size of the hernia makes a difference. Options include:  Watchful waiting. This is often suggested if the hernia is small and you have had no symptoms.  No medical procedure will be done unless symptoms develop.  You will need to watch closely for symptoms. If any occur, contact your healthcare provider right away.  Surgery. This is used if the hernia is larger or you have symptoms.  Open surgery. This is usually an outpatient procedure (you will not stay overnight in a hospital). An cut (incision) is made through the skin in the groin. The hernia is put back inside the abdomen. The weak area in the muscles is then repaired by herniorrhaphy or hernioplasty. Herniorrhaphy: in this type of surgery, the weak muscles are sewn back together. Hernioplasty: a patch or mesh is used to close the  weak area in the abdominal wall.  Laparoscopy. In this procedure, a surgeon makes small incisions. A thin tube with a tiny video camera (called a laparoscope) is put into the abdomen. The surgeon repairs the hernia with mesh by looking with the video camera and using two long instruments. HOME CARE INSTRUCTIONS   After surgery to repair an inguinal hernia:  You will need to take pain medicine prescribed by your healthcare provider. Follow all directions carefully.  You will need to take care of the wound from  the incision.  Your activity will be restricted for awhile. This will probably include no heavy lifting for several weeks. You also should not do anything too active for a few weeks. When you can return to work will depend on the type of job that you have.  During "watchful waiting" periods, you should:  Maintain a healthy weight.  Eat a diet high in fiber (fruits, vegetables and whole grains).  Drink plenty of fluids to avoid constipation. This means drinking enough water and other liquids to keep your urine clear or pale yellow.  Do not lift heavy objects.  Do not stand for long periods of time.  Quit smoking. This should keep you from developing a frequent cough. SEEK MEDICAL CARE IF:   A bulge develops in your groin area.  You feel pain, a burning sensation or pressure in the groin. This might be worse if you are lifting or straining.  You develop a fever of more than 100.5 F (38.1 C). SEEK IMMEDIATE MEDICAL CARE IF:   Pain in the groin increases suddenly.  A bulge in the groin gets bigger suddenly and does not go down.  For men, there is sudden pain in the scrotum. Or, the size of the scrotum increases.  A bulge in the groin area becomes red or purple and is painful to touch.  You have nausea or vomiting that does not go away.  You feel your heart beating much faster than normal.  You cannot have a bowel movement or pass gas.  You develop a fever of more than 102.0 F (38.9 C).   This information is not intended to replace advice given to you by your health care provider. Make sure you discuss any questions you have with your health care provider.   Document Released: 03/31/2009 Document Revised: 02/04/2012 Document Reviewed: 05/16/2015 Elsevier Interactive Patient Education [employer].

## 2016-06-11 NOTE — Progress Notes (Signed)
Pre visit review using our clinic review tool, if applicable. No additional management support is needed unless otherwise documented below in the visit note. 

## 2016-06-11 NOTE — Assessment & Plan Note (Signed)
Non-tender, likely recurrent in nature Will refer to surgery for possible surgery

## 2016-06-11 NOTE — Assessment & Plan Note (Signed)
Will refer to surgery for possible removal Not currently infected

## 2016-06-11 NOTE — Progress Notes (Signed)
Subjective:    Patient ID: Mathew Stafford, male    DOB: [birth year], 70 y.o.   MRN: [id number]  HPI He is here for an acute visit.   He noticed a lump in his left groin area about two weeks ago.  He denies any pain.  He denies any other lumps.  He is unsure if it is bigger, but it might be a little larger.  He denies abdominal pain, nausea, penile or scrotal pain/discharge.  He denies leg infections.  He does have a history of an inguinal hernia and thinks it was on this side.  He had it repaired in the past.    He also has a cyst on his back he needs to have removed.   Medications and allergies reviewed with patient and updated if appropriate.  Patient Active Problem List   Diagnosis Date Noted  . BRADYCARDIA 11/28/2010  . Hyperlipidemia 07/15/2008  . History of anemia 07/15/2008  . NONDEPENDENT TOBACCO USE DISORDER 07/15/2008  . COLONIC POLYPS, HX OF 07/15/2008  . 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 CHEST XRAY, ABNORMAL 07/15/2008    Current Outpatient Prescriptions on File Prior to Visit  Medication Sig Dispense Refill  . aspirin 81 MG tablet Take 81 mg by mouth daily.      . Multiple Vitamin (MULTIVITAMIN) tablet Take 1 tablet by mouth daily.      . Omega-3 Fatty Acids (FISH OIL) 1000 MG CAPS Take by mouth 3 (three) times daily.      . Probiotic Product (PROBIOTIC DAILY PO) Take by mouth daily.    . vitamin C (ASCORBIC ACID) 500 MG tablet Take 500 mg by mouth daily.    . Wheat Dextrin (BENEFIBER DRINK MIX PO) Take by mouth.     No current facility-administered medications on file prior to visit.    Past Medical History  Diagnosis Date  . Anemia 2006    H&H 12.7/37.7 ; normal B12, folate, iron levels  . Hyperlipidemia     < 100  . Renal calculi     X 1; [place]  . Adenomatous colon polyp     X 2;Dr Collene Mares    Past Surgical History  Procedure Laterality Date  . Colonoscopy w/ polypectomy  2002& 2006    Dr Collene Mares  . Inguinal hernia repair    . Colonoscopy  2011    negative ; [facility]    Social History   Social History  . Marital Status: Divorced    Spouse Name: N/A  . Number of Children: N/A  . Years of Education: N/A   Social History Main Topics  . Smoking status: Current Every Day Smoker -- 0.25 packs/day    Types: Cigarettes  . Smokeless tobacco: None     Comment: smoked age 75-present , up to 1 ppd ; 11/14/15 3 cigarettes a day  . Alcohol Use: 8.4 oz/week    14 Glasses of wine per week     Comment: 2 glasses   . Drug Use: No  . Sexual Activity: Not Asked   Other Topics Concern  . None   Social History Narrative    Family History  Problem Relation Age of Onset  . Arthritis Mother     OA  . Hypertension Mother   . Hypertension Maternal Aunt   . Diabetes Maternal Uncle   . Heart disease Maternal Grandmother     PACER  . Stroke Neg Hx   . Heart attack Neg Hx  Review of Systems  Constitutional: Negative for fever, chills, appetite change, fatigue and unexpected weight change.  Respiratory: Negative for cough, shortness of breath and wheezing.   Cardiovascular: Negative for chest pain, palpitations and leg swelling.  Gastrointestinal: Negative for abdominal pain and constipation.  Genitourinary: Negative for dysuria, hematuria, discharge, scrotal swelling, difficulty urinating, penile pain and testicular pain.  Neurological: Negative for light-headedness and headaches.  Hematological: Negative for adenopathy.       Objective:   Filed Vitals:   06/11/16 1607  BP: 120/72  Pulse: 78  Temp: 98.6 F (37 C)   Filed Weights   06/11/16 1607  Weight: 133 lb 12.8 oz (60.691 kg)   Body mass index is 20.35 kg/(m^2).   Physical Exam  Constitutional: He appears well-developed and well-nourished. No distress.  Genitourinary:  Suprapubic lump on left side that is non tender, non reducible, no inguinal lymphadenopathy Penis and scrotal exam deferred  Skin: He is not diaphoretic.  Non tender cyst left upper back           Assessment & Plan:   See Problem List for Assessment and Plan of chronic medical problems.

## 2016-07-03 ENCOUNTER — Ambulatory Visit: Payer: Self-pay | Admitting: Surgery

## 2016-12-02 ENCOUNTER — Encounter: Payer: Self-pay | Admitting: Internal Medicine

## 2016-12-02 NOTE — Progress Notes (Signed)
Subjective:    Patient ID: Mathew Stafford, male    DOB: 02/29/1946, 71 y.o.   MRN: [id number]  HPI Here for medicare wellness exam and follow up of chronic medical problems  Hyperlipidemia:  He has had mildly elevated cholesterol in the past.  He has never been on medication.  He eats healthy, has lost weight and exercises regularly.   History of anemia: He has a history of anemia.  He has had some mild headaches and lightheadedness, but only once a week.  He denies chest pain, sob and palpitations.  Headaches:  He gets them about once a week.  He does not take anything for them. It started 6 months ago.  It is band like pain around his head.  There are no obvious causes.  He typically does not get headaches. It lasts about 30-60 minutes and then resolves on its own.    I have personally reviewed and have noted 1.The patient's medical and social history 2.Their use of alcohol, tobacco or illicit drugs 3.Their current medications and supplements 4.The patient's functional ability including ADL's, fall risks, home safety risks and                 hearing or visual impairment. 5.Diet and physical activities 6.Evidence for depression or mood disorders 7.Care team reviewed  - none  Are there smokers in your home (other than you)? No  Risk Factors Exercise: swim 4/week Dietary issues discussed: not eating as much as he should, eating fruits and veges, mother lives with him - he tends to eat what she eats.  Neither cooks.    Cardiac risk factors: advanced age, hyperlipidemia  Depression Screen  Have you felt down, depressed or hopeless? No  Have you felt little interest or pleasure in doing things?  No  Activities of Daily Living In your present state of health, do you have any difficulty performing the following activities?:  Driving? No Managing money?  No Feeding yourself? No Getting from bed to chair?  No Climbing a flight of stairs? No Preparing food and eating?: No Bathing or showering? No Getting dressed: No Getting to/using the toilet? No Moving around from place to place: No In the past year have you fallen or had a near fall?: No   Are you sexually active?  No  Do you have more than one partner?  No  Hearing Difficulties: No Do you often ask people to speak up or repeat themselves? No Do you experience ringing or noises in your ears? No Do you have difficulty understanding soft or whispered voices? No Vision:              Any change in vision:  no             Up to date with eye exam: yes  Memory:  Do you feel that you have a problem with memory? No  Do you often misplace items? No  Do you feel safe at home?  Yes  Cognitive Testing  Alert, Orientated? Yes  Normal Appearance? Yes  Recall of three objects?  Yes  Can perform simple calculations? Yes  Displays appropriate judgment? Yes  Can read the correct time from a watch face? Yes   Advanced Directives have been discussed with the patient? Yes   Medications and allergies reviewed with patient and updated if appropriate.  Patient Active Problem List   Diagnosis Date Noted  . Skin cyst 06/11/2016  . Unilateral recurrent inguinal hernia without  obstruction or gangrene 06/11/2016  . BRADYCARDIA 11/28/2010  . Hyperlipidemia 07/15/2008  . History of anemia 07/15/2008  . NONDEPENDENT TOBACCO USE DISORDER 07/15/2008  . COLONIC POLYPS, HX OF 07/15/2008  . 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 CHEST XRAY, ABNORMAL 07/15/2008    Current Outpatient Prescriptions on File Prior to Visit  Medication Sig Dispense Refill  . aspirin 81 MG tablet Take 81 mg by mouth daily.      . Multiple Vitamin (MULTIVITAMIN) tablet Take 1 tablet by mouth daily.      . Omega-3 Fatty Acids (FISH OIL) 1000 MG CAPS Take by mouth 3 (three) times daily.      . Probiotic Product (PROBIOTIC DAILY PO) Take by mouth daily.    . vitamin C  (ASCORBIC ACID) 500 MG tablet Take 500 mg by mouth daily.    . Wheat Dextrin (BENEFIBER DRINK MIX PO) Take by mouth.     No current facility-administered medications on file prior to visit.     Past Medical History:  Diagnosis Date  . Adenomatous colon polyp    X 2;Dr Collene Mares  . Anemia 2006   H&H 12.7/37.7 ; normal B12, folate, iron levels  . Hyperlipidemia    < 100  . Renal calculi    X 1; [place]    Past Surgical History:  Procedure Laterality Date  . COLONOSCOPY  2011   negative ; [facility]  . COLONOSCOPY W/ POLYPECTOMY  2002& 2006   Dr Collene Mares  . INGUINAL HERNIA REPAIR      Social History   Social History  . Marital status: Divorced    Spouse name: N/A  . Number of children: N/A  . Years of education: N/A   Social History Main Topics  . Smoking status: Current Every Day Smoker    Packs/day: 0.25    Types: Cigarettes  . Smokeless tobacco: Not on file     Comment: smoked age 6-present , up to 1 ppd ; 11/14/15 3 cigarettes a day  . Alcohol use 8.4 oz/week    14 Glasses of wine per week     Comment: 2 glasses   . Drug use: No  . Sexual activity: Not on file   Other Topics Concern  . Not on file   Social History Narrative  . No narrative on file    Family History  Problem Relation Age of Onset  . Arthritis Mother     OA  . Hypertension Mother   . Hypertension Maternal Aunt   . Diabetes Maternal Uncle   . Heart disease Maternal Grandmother     PACER  . Stroke Neg Hx   . Heart attack Neg Hx     Review of Systems  Constitutional: Negative for chills, fatigue and fever.  HENT: Negative for hearing loss and tinnitus.   Eyes: Negative for visual disturbance.  Respiratory: Negative for cough, shortness of breath and wheezing.   Cardiovascular: Negative for chest pain, palpitations and leg swelling.  Gastrointestinal: Negative for abdominal pain, blood in stool, constipation, diarrhea and nausea.       No gerd  Genitourinary: Negative for difficulty  urinating, dysuria and hematuria.  Musculoskeletal: Negative for arthralgias, back pain and myalgias.  Skin: Negative for color change and rash.  Neurological: Positive for light-headedness and headaches. Negative for dizziness, weakness and numbness.  Psychiatric/Behavioral: Negative for dysphoric mood. The patient is not nervous/anxious.        Objective:   Vitals:   12/03/16 0759  BP: 140/82  Pulse: (!) 52  Resp: 16  Temp: 98.2 F (36.8 C)   Filed Weights   12/03/16 0759  Weight: 134 lb (60.8 kg)   Body mass index is 20.37 kg/m.  Wt Readings from Last 3 Encounters:  12/03/16 134 lb (60.8 kg)  06/11/16 133 lb 12.8 oz (60.7 kg)  12/05/15 139 lb 8 oz (63.3 kg)     Physical Exam Constitutional: He appears well-developed and well-nourished. No distress.  HENT:  Head: Normocephalic and atraumatic.  Right Ear: External ear normal.  Left Ear: External ear normal.  Mouth/Throat: Oropharynx is clear and moist.  Normal ear canals and TM b/l  Eyes: Conjunctivae and EOM are normal.  Neck: Neck supple. No tracheal deviation present. No thyromegaly present.  No carotid bruit  Cardiovascular: Normal rate, regular rhythm, normal heart sounds and intact distal pulses.   No murmur heard. Pulmonary/Chest: Effort normal and breath sounds normal. No respiratory distress. He has no wheezes. He has no rales.  Abdominal: Soft. Bowel sounds are normal. He exhibits no distension. There is no tenderness.  Genitourinary: deferred  Musculoskeletal: He exhibits no edema.  Lymphadenopathy:   He has no cervical adenopathy.  Skin: Skin is warm and dry. He is not diaphoretic.  Psychiatric: He has a normal mood and affect. His behavior is normal.         Assessment & Plan:   Wellness Exam: Immunizations   Up to date  Colonoscopy   Up to date  Eye exam  Up to date  Hearing loss  none Memory concerns/difficulties  none Independent of ADLs   Fully  Stressed the importance of regular  exercise, which he is doing   Patient received copy of preventative screening tests/immunizations recommended for the next 5-10 years.  See Problem List for Assessment and Plan of chronic medical problems.   FU annually

## 2016-12-02 NOTE — Patient Instructions (Addendum)
Mathew Stafford , Thank you for taking time to come for your Medicare Wellness Visit. I appreciate your ongoing commitment to your health goals. Please review the following plan we discussed and let me know if I can assist you in the future.   These are the goals we discussed: Goals    . none                This is a list of the screening recommended for you and due dates:  Health Maintenance  Topic Date Due  . Flu Shot  06/26/2016  . Tetanus Vaccine  07/15/2018  . Colon Cancer Screening  10/11/2019  . Shingles Vaccine  Completed  .  Hepatitis C: One time screening is recommended by Center for Disease Control  (CDC) for  adults born from [birth year] through 1963.   Completed  . Pneumonia vaccines  Completed   Test(s) ordered today. Your results will be released to [place] (or called to you) after review, usually within 72hours after test completion. If any changes need to be made, you will be notified at that same time.  All other Health Maintenance issues reviewed.   All recommended immunizations and age-appropriate screenings are up-to-date or discussed.  No immunizations administered today.   Medications reviewed and updated.  No changes recommended at this time.    Please followup in one year for a physical    Health Maintenance, Male A healthy lifestyle and preventative care can promote health and wellness.  Maintain regular health, dental, and eye exams.  Eat a healthy diet. Foods like vegetables, fruits, whole grains, low-fat dairy products, and lean protein foods contain the nutrients you need and are low in calories. Decrease your intake of foods high in solid fats, added sugars, and salt. Get information about a proper diet from your health care provider, if necessary.  Regular physical exercise is one of the most important things you can do for your health. Most adults should get at least 150 minutes of moderate-intensity exercise (any activity that increases your heart  rate and causes you to sweat) each week. In addition, most adults need muscle-strengthening exercises on 2 or more days a week.   Maintain a healthy weight. The body mass index (BMI) is a screening tool to identify possible weight problems. It provides an estimate of body fat based on height and weight. Your health care provider can find your BMI and can help you achieve or maintain a healthy weight. For males 20 years and older:  A BMI below 18.5 is considered underweight.  A BMI of 18.5 to 24.9 is normal.  A BMI of 25 to 29.9 is considered overweight.  A BMI of 30 and above is considered obese.  Maintain normal blood lipids and cholesterol by exercising and minimizing your intake of saturated fat. Eat a balanced diet with plenty of fruits and vegetables. Blood tests for lipids and cholesterol should begin at age 101 and be repeated every 5 years. If your lipid or cholesterol levels are high, you are over age 54, or you are at high risk for heart disease, you may need your cholesterol levels checked more frequently.Ongoing high lipid and cholesterol levels should be treated with medicines if diet and exercise are not working.  If you smoke, find out from your health care provider how to quit. If you do not use tobacco, do not start.  Lung cancer screening is recommended for adults aged 41-80 years who are at high risk for developing lung  cancer because of a history of smoking. A yearly low-dose CT scan of the lungs is recommended for people who have at least a 30-pack-year history of smoking and are current smokers or have quit within the past 15 years. A pack year of smoking is smoking an average of 1 pack of cigarettes a day for 1 year (for example, a 30-pack-year history of smoking could mean smoking 1 pack a day for 30 years or 2 packs a day for 15 years). Yearly screening should continue until the smoker has stopped smoking for at least 15 years. Yearly screening should be stopped for people  who develop a health problem that would prevent them from having lung cancer treatment.  If you choose to drink alcohol, do not have more than 2 drinks per day. One drink is considered to be 12 oz (360 mL) of beer, 5 oz (150 mL) of wine, or 1.5 oz (45 mL) of liquor.  Avoid the use of street drugs. Do not share needles with anyone. Ask for help if you need support or instructions about stopping the use of drugs.  High blood pressure causes heart disease and increases the risk of stroke. High blood pressure is more likely to develop in:  People who have blood pressure in the end of the normal range (100-139/85-89 mm Hg).  People who are overweight or obese.  People who are African American.  If you are 8-35 years of age, have your blood pressure checked every 3-5 years. If you are 70 years of age or older, have your blood pressure checked every year. You should have your blood pressure measured twice-once when you are at a hospital or clinic, and once when you are not at a hospital or clinic. Record the average of the two measurements. To check your blood pressure when you are not at a hospital or clinic, you can use:  An automated blood pressure machine at a pharmacy.  A home blood pressure monitor.  If you are 29-55 years old, ask your health care provider if you should take aspirin to prevent heart disease.  Diabetes screening involves taking a blood sample to check your fasting blood sugar level. This should be done once every 3 years after age 57 if you are at a normal weight and without risk factors for diabetes. Testing should be considered at a younger age or be carried out more frequently if you are overweight and have at least 1 risk factor for diabetes.  Colorectal cancer can be detected and often prevented. Most routine colorectal cancer screening begins at the age of 71 and continues through age 24. However, your health care provider may recommend screening at an earlier age if  you have risk factors for colon cancer. On a yearly basis, your health care provider may provide home test kits to check for hidden blood in the stool. A small camera at the end of a tube may be used to directly examine the colon (sigmoidoscopy or colonoscopy) to detect the earliest forms of colorectal cancer. Talk to your health care provider about this at age 35 when routine screening begins. A direct exam of the colon should be repeated every 5-10 years through age 28, unless early forms of precancerous polyps or small growths are found.  People who are at an increased risk for hepatitis B should be screened for this virus. You are considered at high risk for hepatitis B if:  You were born in a country where hepatitis B occurs  often. Talk with your health care provider about which countries are considered high risk.  Your parents were born in a high-risk country and you have not received a shot to protect against hepatitis B (hepatitis B vaccine).  You have HIV or AIDS.  You use needles to inject street drugs.  You live with, or have sex with, someone who has hepatitis B.  You are a man who has sex with other men (MSM).  You get hemodialysis treatment.  You take certain medicines for conditions like cancer, organ transplantation, and autoimmune conditions.  Hepatitis C blood testing is recommended for all people born from [birth year] through 1965 and any individual with known risk factors for hepatitis C.  Healthy men should no longer receive prostate-specific antigen (PSA) blood tests as part of routine cancer screening. Talk to your health care provider about prostate cancer screening.  Testicular cancer screening is not recommended for adolescents or adult males who have no symptoms. Screening includes self-exam, a health care provider exam, and other screening tests. Consult with your health care provider about any symptoms you have or any concerns you have about testicular  cancer.  Practice safe sex. Use condoms and avoid high-risk sexual practices to reduce the spread of sexually transmitted infections (STIs).  You should be screened for STIs, including gonorrhea and chlamydia if:  You are sexually active and are younger than 24 years.  You are older than 24 years, and your health care provider tells you that you are at risk for this type of infection.  Your sexual activity has changed since you were last screened, and you are at an increased risk for chlamydia or gonorrhea. Ask your health care provider if you are at risk.  If you are at risk of being infected with HIV, it is recommended that you take a prescription medicine daily to prevent HIV infection. This is called pre-exposure prophylaxis (PrEP). You are considered at risk if:  You are a man who has sex with other men (MSM).  You are a heterosexual man who is sexually active with multiple partners.  You take drugs by injection.  You are sexually active with a partner who has HIV.  Talk with your health care provider about whether you are at high risk of being infected with HIV. If you choose to begin PrEP, you should first be tested for HIV. You should then be tested every 3 months for as long as you are taking PrEP.  Use sunscreen. Apply sunscreen liberally and repeatedly throughout the day. You should seek shade when your shadow is shorter than you. Protect yourself by wearing long sleeves, pants, a wide-brimmed hat, and sunglasses year round whenever you are outdoors.  Tell your health care provider of new moles or changes in moles, especially if there is a change in shape or color. Also, tell your health care provider if a mole is larger than the size of a pencil eraser.  A one-time screening for abdominal aortic aneurysm (AAA) and surgical repair of large AAAs by ultrasound is recommended for men aged 91-75 years who are current or former smokers.  Stay current with your vaccines  (immunizations). This information is not intended to replace advice given to you by your health care provider. Make sure you discuss any questions you have with your health care provider. Document Released: 05/10/2008 Document Revised: 12/03/2014 Document Reviewed: 08/16/2015 Elsevier Interactive Patient Education  2017 [employer].

## 2016-12-03 ENCOUNTER — Ambulatory Visit (INDEPENDENT_AMBULATORY_CARE_PROVIDER_SITE_OTHER): Payer: Medicare Other | Admitting: Internal Medicine

## 2016-12-03 ENCOUNTER — Encounter: Payer: Self-pay | Admitting: Internal Medicine

## 2016-12-03 ENCOUNTER — Other Ambulatory Visit (INDEPENDENT_AMBULATORY_CARE_PROVIDER_SITE_OTHER): Payer: Medicare Other

## 2016-12-03 VITALS — BP 140/82 | HR 52 | Temp 98.2°F | Resp 16 | Ht 68.0 in | Wt 134.0 lb

## 2016-12-03 DIAGNOSIS — Z125 Encounter for screening for malignant neoplasm of prostate: Secondary | ICD-10-CM | POA: Diagnosis not present

## 2016-12-03 DIAGNOSIS — E7800 Pure hypercholesterolemia, unspecified: Secondary | ICD-10-CM

## 2016-12-03 DIAGNOSIS — Z862 Personal history of diseases of the blood and blood-forming organs and certain disorders involving the immune mechanism: Secondary | ICD-10-CM

## 2016-12-03 DIAGNOSIS — R519 Headache, unspecified: Secondary | ICD-10-CM

## 2016-12-03 DIAGNOSIS — Z0000 Encounter for general adult medical examination without abnormal findings: Secondary | ICD-10-CM

## 2016-12-03 DIAGNOSIS — K4091 Unilateral inguinal hernia, without obstruction or gangrene, recurrent: Secondary | ICD-10-CM | POA: Diagnosis not present

## 2016-12-03 DIAGNOSIS — R51 Headache: Secondary | ICD-10-CM | POA: Diagnosis not present

## 2016-12-03 LAB — COMPREHENSIVE METABOLIC PANEL
ALBUMIN: 4.3 g/dL (ref 3.5–5.2)
ALK PHOS: 50 U/L (ref 39–117)
ALT: 17 U/L (ref 0–53)
AST: 19 U/L (ref 0–37)
BILIRUBIN TOTAL: 0.7 mg/dL (ref 0.2–1.2)
BUN: 13 mg/dL (ref 6–23)
CALCIUM: 9.7 mg/dL (ref 8.4–10.5)
CO2: 31 mEq/L (ref 19–32)
CREATININE: 0.92 mg/dL (ref 0.40–1.50)
Chloride: 105 mEq/L (ref 96–112)
GFR: 104.93 mL/min (ref >60.00)
Glucose, Bld: 81 mg/dL (ref 70–99)
Potassium: 4.7 mEq/L (ref 3.5–5.1)
Sodium: 142 mEq/L (ref 135–145)
Total Protein: 7.6 g/dL (ref 6.0–8.3)

## 2016-12-03 LAB — CBC WITH DIFFERENTIAL/PLATELET
BASOS ABS: 0 10*3/uL (ref 0.0–0.1)
Basophils Relative: 0.4 % (ref 0.0–3.0)
EOS ABS: 0.1 10*3/uL (ref 0.0–0.7)
Eosinophils Relative: 2.5 % (ref 0.0–5.0)
HEMATOCRIT: 40.7 % (ref 39.0–52.0)
HEMOGLOBIN: 13.4 g/dL (ref 13.0–17.0)
LYMPHS PCT: 29.1 % (ref 12.0–46.0)
Lymphs Abs: 1.1 10*3/uL (ref 0.7–4.0)
MCHC: 33 g/dL (ref 30.0–36.0)
MCV: 90.8 fl (ref 78.0–100.0)
MONOS PCT: 12.6 % — AB (ref 3.0–12.0)
Monocytes Absolute: 0.5 10*3/uL (ref 0.1–1.0)
Neutro Abs: 2.1 10*3/uL (ref 1.4–7.7)
Neutrophils Relative %: 55.4 % (ref 43.0–77.0)
PLATELETS: 191 10*3/uL (ref 150.0–400.0)
RBC: 4.48 Mil/uL (ref 4.22–5.81)
RDW: 13.8 % (ref 11.5–15.5)
WBC: 3.8 10*3/uL — ABNORMAL LOW (ref 4.0–10.5)

## 2016-12-03 LAB — LIPID PANEL
CHOL/HDL RATIO: 3
CHOLESTEROL: 193 mg/dL (ref 0–200)
HDL: 70.7 mg/dL (ref >39.00)
LDL Cholesterol: 108 mg/dL — ABNORMAL HIGH (ref 0–99)
NonHDL: 122.47
Triglycerides: 72 mg/dL (ref 0.0–149.0)
VLDL: 14.4 mg/dL (ref 0.0–40.0)

## 2016-12-03 LAB — PSA, MEDICARE: PSA: 1.2 ng/ml (ref 0.10–4.00)

## 2016-12-03 LAB — TSH: TSH: 1.16 u[IU]/mL (ref 0.35–4.50)

## 2016-12-03 NOTE — Progress Notes (Signed)
Pre visit review using our clinic review tool, if applicable. No additional management support is needed unless otherwise documented below in the visit note. 

## 2016-12-06 ENCOUNTER — Encounter: Payer: Self-pay | Admitting: Internal Medicine

## 2016-12-13 DIAGNOSIS — R519 Headache, unspecified: Secondary | ICD-10-CM | POA: Insufficient documentation

## 2016-12-13 DIAGNOSIS — R51 Headache: Secondary | ICD-10-CM

## 2016-12-13 NOTE — Assessment & Plan Note (Signed)
Occurring about once a week, resolves on its own Tension like headaches Possible dehydration, stress, fatigue No additional evaluation needed since occurring once a week and resolves on its own after short duration otc treatment as needed Monitor for now

## 2016-12-13 NOTE — Assessment & Plan Note (Signed)
Not on medication - controlled with lifestyle Check lipids,cmp, tsh Regular exercise and healthy diet stressed

## 2016-12-13 NOTE — Assessment & Plan Note (Signed)
H/o anemia Check cbc

## 2017-09-19 DIAGNOSIS — Z23 Encounter for immunization: Secondary | ICD-10-CM | POA: Diagnosis not present

## 2017-12-03 NOTE — Patient Instructions (Addendum)
  Mathew Stafford , Thank you for taking time to come for your Medicare Wellness Visit. I appreciate your ongoing commitment to your health goals. Please review the following plan we discussed and let me know if I can assist you in the future.   These are the goals we discussed: Goals    . Continue your healthy lifestyle       This is a list of the screening recommended for you and due dates:  Health Maintenance  Topic Date Due  . Flu Shot  06/26/2017  . Tetanus Vaccine  07/15/2018  . Colon Cancer Screening  10/11/2019  .  Hepatitis C: One time screening is recommended by Center for Disease Control  (CDC) for  adults born from [birth year] through 1963.   Completed  . Pneumonia vaccines  Completed      Test(s) ordered today. Your results will be released to [place] (or called to you) after review, usually within 72hours after test completion. If any changes need to be made, you will be notified at that same time.  All other Health Maintenance issues reviewed.   All recommended immunizations and age-appropriate screenings are up-to-date or discussed.  No immunizations administered today.   Medications reviewed and updated.  No changes recommended at this time.   Please followup in one year

## 2017-12-03 NOTE — Progress Notes (Signed)
Subjective:    Patient ID: Mathew Stafford, male    DOB: [birth year], 72 y.o.   MRN: [id number]  HPI Here for medicare wellness exam and follow up his chronic medical problems.   I have personally reviewed and have noted 1.The patient's medical and social history 2.Their use of alcohol, tobacco or illicit drugs 3.Their current medications and supplements 4.The patient's functional ability including ADL's, fall risks, home                 safety risk and hearing or visual impairment. 5.Diet and physical activities 6.Evidence for depression or mood disorders 7.Care team reviewed  -  None  Hyperlipidemia: He is taking no medication at this time and has never been on medication. He is compliant with a low fat/cholesterol diet. He is exercising regularly.     Are there smokers in your home (other than you)? No  Risk Factors Exercise:  Swimming 6 days/ week- one hour a day Dietary issues discussed:   Eats well balanced  Vitamin and supplement use:  Reviewed vitamins  Opiod use:  none   Cardiac risk factors: advanced age, minimal hyperlipidemia    Depression Screen  Have you felt down, depressed or hopeless? No  Have you felt little interest or pleasure in doing things?  No  Activities of Daily Living In your present state of health, do you have any difficulty performing the following activities?:  Driving? No Managing money?  No Feeding yourself? No Getting from bed to chair? No Climbing a flight of stairs? No Preparing food and eating?: No Bathing or showering? No Getting dressed: No Getting to/using the toilet? No Moving around from place to place: No In the past year have you fallen or had a near fall?: No   Are you sexually active?  No  Do you have more than one partner?  N/A  Hearing Difficulties: No Do you often ask people to speak up or repeat themselves? No Do you experience ringing or  noises in your ears? No Do you have difficulty understanding soft or whispered voices? No Vision:              Any change in vision:  no             Up to date with eye exam:  yes  Memory:  Do you feel that you have a problem with memory? No  Do you often misplace items? No  Do you feel safe at home?  Yes  Cognitive Testing  Alert, Orientated? Yes  Normal Appearance? Yes  Recall of three objects?  Yes  Can perform simple calculations? Yes  Displays appropriate judgment? Yes  Can read the correct time from a watch face? Yes   Advanced Directives have been discussed with the patient? Yes, no questions      Medications and allergies reviewed with patient and updated if appropriate.  Patient Active Problem List   Diagnosis Date Noted  . Headache 12/13/2016  . Skin cyst 06/11/2016  . Unilateral recurrent inguinal hernia without obstruction or gangrene 06/11/2016  . BRADYCARDIA 11/28/2010  . Hyperlipidemia 07/15/2008  . History of anemia 07/15/2008  . NONDEPENDENT TOBACCO USE DISORDER 07/15/2008  . COLONIC POLYPS, HX OF 07/15/2008  . 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 CHEST XRAY, ABNORMAL 07/15/2008    Current Outpatient Medications on File Prior to Visit  Medication Sig Dispense Refill  . aspirin 81 MG tablet Take 81 mg by mouth daily.      Marland Kitchen  Cholecalciferol (VITAMIN D3) 2000 units TABS Take 2,000 Units by mouth daily.    . Multiple Vitamin (MULTIVITAMIN) tablet Take 1 tablet by mouth daily.      . Omega-3 Fatty Acids (FISH OIL) 1000 MG CAPS Take by mouth 3 (three) times daily.      . Probiotic Product (PROBIOTIC DAILY PO) Take by mouth daily.    . vitamin C (ASCORBIC ACID) 500 MG tablet Take 500 mg by mouth daily.    . Wheat Dextrin (BENEFIBER DRINK MIX PO) Take by mouth.     No current facility-administered medications on file prior to visit.     Past Medical History:  Diagnosis Date  . Adenomatous colon polyp    X 2;Dr Collene Mares  . Anemia 2006   H&H 12.7/37.7 ;  normal B12, folate, iron levels  . Hyperlipidemia    < 100  . Renal calculi    X 1; [place]    Past Surgical History:  Procedure Laterality Date  . COLONOSCOPY  2011   negative ; [facility]  . COLONOSCOPY W/ POLYPECTOMY  2002& 2006   Dr Collene Mares  . INGUINAL HERNIA REPAIR      Social History   Socioeconomic History  . Marital status: Divorced    Spouse name: Not on file  . Number of children: Not on file  . Years of education: Not on file  . Highest education level: Not on file  Social Needs  . Financial resource strain: Not on file  . Food insecurity - worry: Not on file  . Food insecurity - inability: Not on file  . Transportation needs - medical: Not on file  . Transportation needs - non-medical: Not on file  Occupational History  . Not on file  Tobacco Use  . Smoking status: Current Every Day Smoker    Packs/day: 0.25    Types: Cigarettes  . Tobacco comment: smoked age 38-present , up to 1 ppd ; 11/14/15 3 cigarettes a day  Substance and Sexual Activity  . Alcohol use: Yes    Alcohol/week: 8.4 oz    Types: 14 Glasses of wine per week    Comment: 2 glasses   . Drug use: No  . Sexual activity: Not on file  Other Topics Concern  . Not on file  Social History Narrative  . Not on file    Family History  Problem Relation Age of Onset  . Arthritis Mother        OA  . Hypertension Mother   . Hypertension Maternal Aunt   . Diabetes Maternal Uncle   . Heart disease Maternal Grandmother        PACER  . Stroke Neg Hx   . Heart attack Neg Hx     Review of Systems  Constitutional: Negative for chills and fever.  HENT: Negative for hearing loss and tinnitus.   Eyes: Negative for visual disturbance.  Respiratory: Negative for cough, shortness of breath and wheezing.   Cardiovascular: Negative for chest pain, palpitations and leg swelling.  Gastrointestinal: Negative for abdominal pain, blood in stool, constipation, diarrhea and nausea.  Genitourinary: Negative for  difficulty urinating, dysuria and hematuria.  Neurological: Negative for light-headedness and headaches.  Psychiatric/Behavioral: Negative for dysphoric mood. The patient is not nervous/anxious.        Objective:   Vitals:   12/04/17 0755  BP: 124/80  Pulse: (!) 55  Resp: 16  Temp: 98 F (36.7 C)  SpO2: 98%   Filed Weights   12/04/17  0755  Weight: 133 lb (60.3 kg)   Body mass index is 20.22 kg/m.  Wt Readings from Last 3 Encounters:  12/04/17 133 lb (60.3 kg)  12/03/16 134 lb (60.8 kg)  06/11/16 133 lb 12.8 oz (60.7 kg)     Physical Exam Constitutional: He appears well-developed and well-nourished. No distress.  HENT:  Head: Normocephalic and atraumatic.  Right Ear: External ear normal.  Left Ear: External ear normal.  Mouth/Throat: Oropharynx is clear and moist.  Normal ear canals and TM b/l  Eyes: Conjunctivae and EOM are normal.  Neck: Neck supple. No tracheal deviation present. No thyromegaly present.  No carotid bruit  Cardiovascular: Normal rate, regular rhythm, normal heart sounds and intact distal pulses.   No murmur heard. Pulmonary/Chest: Effort normal and breath sounds normal. No respiratory distress. He has no wheezes. He has no rales.  Abdominal: Soft. He exhibits no distension. There is no tenderness.  Genitourinary: mildly enlarged prostate without nodules Musculoskeletal: He exhibits no edema.  Lymphadenopathy:   He has no cervical adenopathy.  Skin: Skin is warm and dry. He is not diaphoretic.  Psychiatric: He has a normal mood and affect. His behavior is normal.         Assessment & Plan:   Wellness Exam: Immunizations  Flu vaccine due, shingrix discussed, others up to date Colonoscopy    Up to date  Eye exam   Up to date  Hearing loss   none Memory concerns/difficulties   none Independent of ADLs     fully Stressed the importance of regular exercise, which he is doing Encouraged smoking cessation   Patient received copy of  preventative screening tests/immunizations recommended for the next 5-10 years.     See Problem List for Assessment and Plan of chronic medical problems.   FU in one year

## 2017-12-04 ENCOUNTER — Other Ambulatory Visit (INDEPENDENT_AMBULATORY_CARE_PROVIDER_SITE_OTHER): Payer: Medicare Other

## 2017-12-04 ENCOUNTER — Encounter: Payer: Self-pay | Admitting: Internal Medicine

## 2017-12-04 ENCOUNTER — Ambulatory Visit (INDEPENDENT_AMBULATORY_CARE_PROVIDER_SITE_OTHER): Payer: Medicare Other | Admitting: Internal Medicine

## 2017-12-04 VITALS — BP 124/80 | HR 55 | Temp 98.0°F | Resp 16 | Ht 68.0 in | Wt 133.0 lb

## 2017-12-04 DIAGNOSIS — Z125 Encounter for screening for malignant neoplasm of prostate: Secondary | ICD-10-CM

## 2017-12-04 DIAGNOSIS — Z0000 Encounter for general adult medical examination without abnormal findings: Secondary | ICD-10-CM | POA: Diagnosis not present

## 2017-12-04 DIAGNOSIS — E7849 Other hyperlipidemia: Secondary | ICD-10-CM

## 2017-12-04 LAB — CBC WITH DIFFERENTIAL/PLATELET
BASOS ABS: 0 10*3/uL (ref 0.0–0.1)
Basophils Relative: 0.2 % (ref 0.0–3.0)
EOS ABS: 0.3 10*3/uL (ref 0.0–0.7)
Eosinophils Relative: 5.4 % — ABNORMAL HIGH (ref 0.0–5.0)
HEMATOCRIT: 40.8 % (ref 39.0–52.0)
HEMOGLOBIN: 13.2 g/dL (ref 13.0–17.0)
LYMPHS PCT: 12.2 % (ref 12.0–46.0)
Lymphs Abs: 0.7 10*3/uL (ref 0.7–4.0)
MCHC: 32.5 g/dL (ref 30.0–36.0)
MCV: 94 fl (ref 78.0–100.0)
MONO ABS: 0.7 10*3/uL (ref 0.1–1.0)
Monocytes Relative: 12.5 % — ABNORMAL HIGH (ref 3.0–12.0)
Neutro Abs: 3.9 10*3/uL (ref 1.4–7.7)
Neutrophils Relative %: 69.7 % (ref 43.0–77.0)
Platelets: 182 10*3/uL (ref 150.0–400.0)
RBC: 4.34 Mil/uL (ref 4.22–5.81)
RDW: 14.1 % (ref 11.5–15.5)
WBC: 5.6 10*3/uL (ref 4.0–10.5)

## 2017-12-04 LAB — COMPREHENSIVE METABOLIC PANEL
ALBUMIN: 4.5 g/dL (ref 3.5–5.2)
ALK PHOS: 49 U/L (ref 39–117)
ALT: 15 U/L (ref 0–53)
AST: 22 U/L (ref 0–37)
BILIRUBIN TOTAL: 0.7 mg/dL (ref 0.2–1.2)
BUN: 12 mg/dL (ref 6–23)
CALCIUM: 9.5 mg/dL (ref 8.4–10.5)
CO2: 29 mEq/L (ref 19–32)
CREATININE: 0.86 mg/dL (ref 0.40–1.50)
Chloride: 106 mEq/L (ref 96–112)
GFR: 113.09 mL/min (ref >60.00)
Glucose, Bld: 89 mg/dL (ref 70–99)
Potassium: 4.3 mEq/L (ref 3.5–5.1)
SODIUM: 143 meq/L (ref 135–145)
TOTAL PROTEIN: 7.3 g/dL (ref 6.0–8.3)

## 2017-12-04 LAB — LIPID PANEL
CHOLESTEROL: 197 mg/dL (ref 0–200)
HDL: 79.2 mg/dL (ref >39.00)
LDL Cholesterol: 106 mg/dL — ABNORMAL HIGH (ref 0–99)
NonHDL: 118.22
TRIGLYCERIDES: 59 mg/dL (ref 0.0–149.0)
Total CHOL/HDL Ratio: 2
VLDL: 11.8 mg/dL (ref 0.0–40.0)

## 2017-12-04 LAB — TSH: TSH: 0.96 u[IU]/mL (ref 0.35–4.50)

## 2017-12-04 LAB — PSA, MEDICARE: PSA: 1.09 ng/ml (ref 0.10–4.00)

## 2017-12-05 ENCOUNTER — Encounter: Payer: Self-pay | Admitting: Internal Medicine

## 2018-04-14 ENCOUNTER — Telehealth: Payer: Self-pay | Admitting: Emergency Medicine

## 2018-04-14 NOTE — Telephone Encounter (Signed)
Copied from [place] [id number]. Topic: Inquiry >> Apr 14, 2018  3:53 PM Conception Chancy, NT wrote: Reason for CRM: patient is calling and states he received a phone call from [facility] stating he could get a pneumonia shot and he would like Dr. Quay Burow thoughts and approval on this being doing so.  >> Apr 14, 2018  3:59 PM Morey Hummingbird wrote: Looks like patient has had both, should I tell patient he is ok?

## 2018-04-14 NOTE — Telephone Encounter (Signed)
Spoke with pt to advise that he has already had both Pneumo vaccines.

## 2018-08-29 DIAGNOSIS — Z23 Encounter for immunization: Secondary | ICD-10-CM | POA: Diagnosis not present

## 2019-01-06 ENCOUNTER — Ambulatory Visit: Payer: Medicare Other | Admitting: Internal Medicine

## 2019-01-07 NOTE — Progress Notes (Signed)
Subjective:    Patient ID: Mathew Stafford, male    DOB: Mar 21, 1946, 73 y.o.   MRN: [id number]  HPI The patient is here for follow up.  He swims daily.  He feels he is eating healthy overall.  He feels well.  He denies any major changes in his health since he was here last.    Right shoulder pain: For a while he has been experiencing right shoulder pain.  He denies any injuries.  He does swim on a daily basis, but states this does not aggravate his pain.  He is interested in having it further evaluated since it has been going on for so long.   Medications and allergies reviewed with patient and updated if appropriate.  Patient Active Problem List   Diagnosis Date Noted  . Skin cyst 06/11/2016  . Unilateral recurrent inguinal hernia without obstruction or gangrene 06/11/2016  . BRADYCARDIA 11/28/2010  . Hyperlipidemia 07/15/2008  . History of anemia 07/15/2008  . NONDEPENDENT TOBACCO USE DISORDER 07/15/2008  . COLONIC POLYPS, HX OF 07/15/2008  . 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 CHEST XRAY, ABNORMAL 07/15/2008    Current Outpatient Medications on File Prior to Visit  Medication Sig Dispense Refill  . aspirin 81 MG tablet Take 81 mg by mouth daily.      . Cholecalciferol (VITAMIN D3) 2000 units TABS Take 2,000 Units by mouth daily.    . Multiple Vitamin (MULTIVITAMIN) tablet Take 1 tablet by mouth daily.      . Omega-3 Fatty Acids (FISH OIL) 1000 MG CAPS Take by mouth 3 (three) times daily.      . Probiotic Product (PROBIOTIC DAILY PO) Take by mouth daily.    . vitamin C (ASCORBIC ACID) 500 MG tablet Take 500 mg by mouth daily.    . Wheat Dextrin (BENEFIBER DRINK MIX PO) Take by mouth.     No current facility-administered medications on file prior to visit.     Past Medical History:  Diagnosis Date  . Adenomatous colon polyp    X 2;Dr Collene Mares  . Anemia 2006   H&H 12.7/37.7 ; normal B12, folate, iron levels  . Hyperlipidemia    < 100  . Renal calculi    X 1; [place]      Past Surgical History:  Procedure Laterality Date  . COLONOSCOPY  2011   negative ; [facility]  . COLONOSCOPY W/ POLYPECTOMY  2002& 2006   Dr Collene Mares  . INGUINAL HERNIA REPAIR      Social History   Socioeconomic History  . Marital status: Divorced    Spouse name: Not on file  . Number of children: Not on file  . Years of education: Not on file  . Highest education level: Not on file  Occupational History  . Not on file  Social Needs  . Financial resource strain: Not on file  . Food insecurity:    Worry: Not on file    Inability: Not on file  . Transportation needs:    Medical: Not on file    Non-medical: Not on file  Tobacco Use  . Smoking status: Current Every Day Smoker    Packs/day: 0.25    Types: Cigarettes  . Smokeless tobacco: Never Used  . Tobacco comment: smoked age 6-present , up to 1 ppd ; 11/14/15 3 cigarettes a day  Substance and Sexual Activity  . Alcohol use: Yes    Alcohol/week: 14.0 standard drinks    Types: 14 Glasses of wine  per week    Comment: 2 glasses   . Drug use: No  . Sexual activity: Not on file  Lifestyle  . Physical activity:    Days per week: Not on file    Minutes per session: Not on file  . Stress: Not on file  Relationships  . Social connections:    Talks on phone: Not on file    Gets together: Not on file    Attends religious service: Not on file    Active member of club or organization: Not on file    Attends meetings of clubs or organizations: Not on file    Relationship status: Not on file  Other Topics Concern  . Not on file  Social History Narrative  . Not on file    Family History  Problem Relation Age of Onset  . Arthritis Mother        OA  . Hypertension Mother   . Hypertension Maternal Aunt   . Diabetes Maternal Uncle   . Heart disease Maternal Grandmother        PACER  . Stroke Neg Hx   . Heart attack Neg Hx     Review of Systems  Constitutional: Negative for chills, fatigue and fever.  Eyes:  Negative for visual disturbance.  Respiratory: Negative for cough, shortness of breath and wheezing.   Cardiovascular: Negative for chest pain, palpitations and leg swelling.  Gastrointestinal: Negative for abdominal pain, blood in stool, constipation, diarrhea and nausea.       No gerd  Genitourinary: Negative for difficulty urinating and dysuria.  Musculoskeletal: Positive for arthralgias (right shoulder).  Neurological: Negative for light-headedness and headaches.  Psychiatric/Behavioral: Negative for dysphoric mood. The patient is not nervous/anxious.        Objective:   Vitals:   01/08/19 1016  BP: 118/64  Pulse: (!) 50  Resp: 16  Temp: 97.9 F (36.6 C)  SpO2: 96%   BP Readings from Last 3 Encounters:  01/08/19 118/64  12/04/17 124/80  12/03/16 140/82   Wt Readings from Last 3 Encounters:  01/08/19 129 lb (58.5 kg)  12/04/17 133 lb (60.3 kg)  12/03/16 134 lb (60.8 kg)   Body mass index is 19.61 kg/m.   Physical Exam    Constitutional: Appears well-developed and well-nourished. No distress.  HENT:  Head: Normocephalic and atraumatic.  Neck: Neck supple. No tracheal deviation present. No thyromegaly present.  No cervical lymphadenopathy Cardiovascular: Normal rate, regular rhythm and normal heart sounds.   No murmur heard. No carotid bruit .  No edema Pulmonary/Chest: Effort normal and breath sounds normal. No respiratory distress. No has no wheezes. No rales.  Abdomen: Soft, nontender, nondistended Skin: Skin is warm and dry. Not diaphoretic.  Psychiatric: Normal mood and affect. Behavior is normal.      Assessment & Plan:    See Problem List for Assessment and Plan of chronic medical problems.

## 2019-01-08 ENCOUNTER — Other Ambulatory Visit (INDEPENDENT_AMBULATORY_CARE_PROVIDER_SITE_OTHER): Payer: Medicare Other

## 2019-01-08 ENCOUNTER — Ambulatory Visit (INDEPENDENT_AMBULATORY_CARE_PROVIDER_SITE_OTHER): Payer: Medicare Other | Admitting: Internal Medicine

## 2019-01-08 ENCOUNTER — Encounter: Payer: Self-pay | Admitting: Internal Medicine

## 2019-01-08 VITALS — BP 118/64 | HR 50 | Temp 97.9°F | Resp 16 | Ht 68.0 in | Wt 129.0 lb

## 2019-01-08 DIAGNOSIS — M25511 Pain in right shoulder: Secondary | ICD-10-CM | POA: Insufficient documentation

## 2019-01-08 DIAGNOSIS — E7849 Other hyperlipidemia: Secondary | ICD-10-CM | POA: Diagnosis not present

## 2019-01-08 DIAGNOSIS — G8929 Other chronic pain: Secondary | ICD-10-CM | POA: Diagnosis not present

## 2019-01-08 DIAGNOSIS — Z862 Personal history of diseases of the blood and blood-forming organs and certain disorders involving the immune mechanism: Secondary | ICD-10-CM

## 2019-01-08 DIAGNOSIS — Z125 Encounter for screening for malignant neoplasm of prostate: Secondary | ICD-10-CM

## 2019-01-08 LAB — COMPREHENSIVE METABOLIC PANEL
ALT: 14 U/L (ref 0–53)
AST: 19 U/L (ref 0–37)
Albumin: 4.5 g/dL (ref 3.5–5.2)
Alkaline Phosphatase: 55 U/L (ref 39–117)
BILIRUBIN TOTAL: 0.8 mg/dL (ref 0.2–1.2)
BUN: 15 mg/dL (ref 6–23)
CO2: 31 mEq/L (ref 19–32)
Calcium: 9.6 mg/dL (ref 8.4–10.5)
Chloride: 105 mEq/L (ref 96–112)
Creatinine, Ser: 0.85 mg/dL (ref 0.40–1.50)
GFR: 107.5 mL/min (ref >60.00)
Glucose, Bld: 89 mg/dL (ref 70–99)
Potassium: 4.5 mEq/L (ref 3.5–5.1)
Sodium: 142 mEq/L (ref 135–145)
TOTAL PROTEIN: 7.5 g/dL (ref 6.0–8.3)

## 2019-01-08 LAB — CBC WITH DIFFERENTIAL/PLATELET
BASOS ABS: 0 10*3/uL (ref 0.0–0.1)
Basophils Relative: 0.9 % (ref 0.0–3.0)
Eosinophils Absolute: 0.1 10*3/uL (ref 0.0–0.7)
Eosinophils Relative: 2.3 % (ref 0.0–5.0)
HCT: 41.6 % (ref 39.0–52.0)
Hemoglobin: 13.5 g/dL (ref 13.0–17.0)
LYMPHS ABS: 1.3 10*3/uL (ref 0.7–4.0)
Lymphocytes Relative: 33.1 % (ref 12.0–46.0)
MCHC: 32.5 g/dL (ref 30.0–36.0)
MCV: 92 fl (ref 78.0–100.0)
Monocytes Absolute: 0.4 10*3/uL (ref 0.1–1.0)
Monocytes Relative: 11.1 % (ref 3.0–12.0)
NEUTROS PCT: 52.6 % (ref 43.0–77.0)
Neutro Abs: 2.1 10*3/uL (ref 1.4–7.7)
Platelets: 195 10*3/uL (ref 150.0–400.0)
RBC: 4.52 Mil/uL (ref 4.22–5.81)
RDW: 12.7 % (ref 11.5–15.5)
WBC: 3.9 10*3/uL — ABNORMAL LOW (ref 4.0–10.5)

## 2019-01-08 LAB — LIPID PANEL
Cholesterol: 171 mg/dL (ref 0–200)
HDL: 69 mg/dL (ref >39.00)
LDL Cholesterol: 93 mg/dL (ref 0–99)
NonHDL: 101.53
Total CHOL/HDL Ratio: 2
Triglycerides: 41 mg/dL (ref 0.0–149.0)
VLDL: 8.2 mg/dL (ref 0.0–40.0)

## 2019-01-08 LAB — TSH: TSH: 0.75 u[IU]/mL (ref 0.35–4.50)

## 2019-01-08 LAB — PSA, MEDICARE: PSA: 1.6 ng/ml (ref 0.10–4.00)

## 2019-01-08 NOTE — Assessment & Plan Note (Signed)
History of elevated cholesterol Diet controlled-exercising regularly and eating healthy Has lost weight over the years Check CMP, lipid panel, TSH

## 2019-01-08 NOTE — Assessment & Plan Note (Signed)
History of anemia Check CBC

## 2019-01-08 NOTE — Assessment & Plan Note (Signed)
Has been experiencing right shoulder pain for 1 month He does swim daily, but states this does not make the pain worse He is interested in having this evaluated further Will refer to sports medicine

## 2019-01-08 NOTE — Assessment & Plan Note (Signed)
Discussed that he is at the age that we do not routinely screen for prostate cancer, but advised that we can check his PSA if he wishes He would prefer to have it checked-ordered

## 2019-01-08 NOTE — Patient Instructions (Signed)
  Tests ordered today. Your results will be released to [place] (or called to you) after review, usually within 72hours after test completion. If any changes need to be made, you will be notified at that same time.  Medications reviewed and updated.  Changes include :   none    A referral was ordered for sports medicine  Please followup in one year

## 2019-01-20 NOTE — Progress Notes (Signed)
[facility] [place] [place], [place] [address] Phone: [phone] Subjective:   Mathew Stafford, am serving as a scribe for Dr. Hulan Saas.   CC: Right shoulder pain  [id number]  Mathew Stafford is a 73 y.o. male coming in with complaint of right shoulder pain. Patient states that he swims 1 mile, 7 days a week. Is unsure if this is the cause of his pain. Also aggravated shoulder with push ups. Pain occurs on superior aspect of shoulder. Pain with flexion. Pain has become chronic. Denies any radiating symptoms. Uses Aleve and stopped swimming for a period to alleviate his pain.        Past Medical History:  Diagnosis Date  . Adenomatous colon polyp    X 2;Dr Collene Mares  . Anemia 2006   H&H 12.7/37.7 ; normal B12, folate, iron levels  . Hyperlipidemia    < 100  . Renal calculi    X 1; [place]   Past Surgical History:  Procedure Laterality Date  . COLONOSCOPY  2011   negative ; [facility]  . COLONOSCOPY W/ POLYPECTOMY  2002& 2006   Dr Collene Mares  . INGUINAL HERNIA REPAIR     Social History   Socioeconomic History  . Marital status: Divorced    Spouse name: Not on file  . Number of children: Not on file  . Years of education: Not on file  . Highest education level: Not on file  Occupational History  . Not on file  Social Needs  . Financial resource strain: Not on file  . Food insecurity:    Worry: Not on file    Inability: Not on file  . Transportation needs:    Medical: Not on file    Non-medical: Not on file  Tobacco Use  . Smoking status: Current Every Day Smoker    Packs/day: 0.25    Types: Cigarettes  . Smokeless tobacco: Never Used  . Tobacco comment: smoked age 59-present , up to 1 ppd ; 11/14/15 3 cigarettes a day  Substance and Sexual Activity  . Alcohol use: Yes    Alcohol/week: 14.0 standard drinks    Types: 14 Glasses of wine per week    Comment: 2 glasses   . Drug use: Stafford  . Sexual activity: Not on file    Lifestyle  . Physical activity:    Days per week: Not on file    Minutes per session: Not on file  . Stress: Not on file  Relationships  . Social connections:    Talks on phone: Not on file    Gets together: Not on file    Attends religious service: Not on file    Active member of club or organization: Not on file    Attends meetings of clubs or organizations: Not on file    Relationship status: Not on file  Other Topics Concern  . Not on file  Social History Narrative  . Not on file   Stafford Known Allergies Family History  Problem Relation Age of Onset  . Arthritis Mother        OA  . Hypertension Mother   . Hypertension Maternal Aunt   . Diabetes Maternal Uncle   . Heart disease Maternal Grandmother        PACER  . Stroke Neg Hx   . Heart attack Neg Hx        Current Outpatient Medications (Analgesics):  .  aspirin 81 MG tablet, Take  81 mg by mouth daily.     Current Outpatient Medications (Other):  Marland Kitchen  Cholecalciferol (VITAMIN D3) 2000 units TABS, Take 2,000 Units by mouth daily. .  Multiple Vitamin (MULTIVITAMIN) tablet, Take 1 tablet by mouth daily.   .  Omega-3 Fatty Acids (FISH OIL) 1000 MG CAPS, Take by mouth 3 (three) times daily.   .  Probiotic Product (PROBIOTIC DAILY PO), Take by mouth daily. .  vitamin C (ASCORBIC ACID) 500 MG tablet, Take 500 mg by mouth daily. .  Wheat Dextrin (BENEFIBER DRINK MIX PO), Take by mouth.    Past medical history, social, surgical and family history all reviewed in electronic medical record.  Stafford pertanent information unless stated regarding to the chief complaint.   Review of Systems:  Stafford headache, visual changes, nausea, vomiting, diarrhea, constipation, dizziness, abdominal pain, skin rash, fevers, chills, night sweats, weight loss, swollen lymph nodes, body aches, joint swelling,  chest pain, shortness of breath, mood changes.  Mild positive muscle aches  Objective  There were Stafford vitals taken for this visit. Systems  examined below as of    General: Stafford apparent distress alert and oriented x3 mood and affect normal, dressed appropriately.  HEENT: Pupils equal, extraocular movements intact  Respiratory: Patient's speak in full sentences and does not appear short of breath  Cardiovascular: Stafford lower extremity edema, non tender, Stafford erythema  Skin: Warm dry intact with Stafford signs of infection or rash on extremities or on axial skeleton.  Abdomen: Soft nontender  Neuro: Cranial nerves II through XII are intact, neurovascularly intact in all extremities with 2+ DTRs and 2+ pulses.  Lymph: Stafford lymphadenopathy of posterior or anterior cervical chain or axillae bilaterally.  Gait normal with good balance and coordination.  MSK:  Non tender with full range of motion and good stability and symmetric strength and tone of  elbows, wrist, hip, knee and ankles bilaterally.  Shoulder: Right Inspection reveals Stafford abnormalities, atrophy or asymmetry. Palpation is normal with Stafford tenderness over AC joint or bicipital groove. ROM is full in all planes. Rotator cuff strength normal throughout. Positive impingement Speeds and Yergason's tests normal. Stafford labral pathology noted with negative Obrien's, negative clunk and good stability mild positive crossover. Normal scapular function observed. Stafford painful arc and Stafford drop arm sign. Stafford apprehension sign Contralateral shoulder unremarkable  MSK US performed of: Right shoulder This study was ordered, performed, and interpreted by Charlann Boxer D.O.  Shoulder:   Supraspinatus: Stafford significant tearing noted of the rotator cuff.  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  Glenohumeral joint seems to be unremarkable but some mild degenerative tearing of the posterior labrum noted. Impression: Acromioclavicular arthritis  [id number]; 15 additional minutes spent for Therapeutic exercises as stated in above notes.  This included exercises focusing on stretching, strengthening, with  significant focus on eccentric aspects.   Long term goals include an improvement in range of motion, strength, endurance as well as avoiding reinjury. Patient's frequency would include in 1-2 times a day, 3-5 times a week for a duration of 6-12 weeks.  Shoulder Exercises that included:  Basic scapular stabilization to include adduction and depression of scapula Scaption, focusing on proper movement and good control Internal and External rotation utilizing a theraband, with elbow tucked at side entire time Rows with theraband  Proper technique shown and discussed handout in great detail with ATC.  All questions were discussed and answered.      Impression and Recommendations:     This case required medical  decision making of moderate complexity. The above documentation has been reviewed and is accurate and complete Mathew Pulley, DO       Note: This dictation was prepared with Dragon dictation along with smaller phrase technology. Any transcriptional errors that result from this process are unintentional.

## 2019-01-21 ENCOUNTER — Ambulatory Visit (INDEPENDENT_AMBULATORY_CARE_PROVIDER_SITE_OTHER): Payer: Medicare Other | Admitting: Family Medicine

## 2019-01-21 ENCOUNTER — Ambulatory Visit: Payer: Self-pay

## 2019-01-21 VITALS — BP 102/64 | HR 57 | Ht 68.0 in | Wt 130.0 lb

## 2019-01-21 DIAGNOSIS — M25511 Pain in right shoulder: Principal | ICD-10-CM

## 2019-01-21 DIAGNOSIS — M19011 Primary osteoarthritis, right shoulder: Secondary | ICD-10-CM | POA: Insufficient documentation

## 2019-01-21 DIAGNOSIS — G8929 Other chronic pain: Secondary | ICD-10-CM | POA: Diagnosis not present

## 2019-01-21 NOTE — Patient Instructions (Signed)
Good to see you  Ice is your friend Ice 20 minutes 2 times daily. Usually after activity and before bed. pennsaid pinkie amount topically 2 times daily as needed.  Keep hands within peripheral vision  Continue the vitamin D See me again in 4 weeks

## 2019-01-21 NOTE — Assessment & Plan Note (Signed)
Patient having signs and symptoms of more of impingement of the shoulder.  Rotator cuff appears to be unremarkable on the ultrasound.  Acromioclavicular joint does have some moderate arthritic changes with some mild swelling that could be contributing.  Patient will try topical anti-inflammatories, home exercises and work with Product/process development scientist, attempted very small amount of osteopathic manipulation today as well.  Worsening symptoms consider injection and formal physical therapy.  Follow-up with me again in 4 weeks

## 2019-02-18 ENCOUNTER — Ambulatory Visit: Payer: Medicare Other | Admitting: Family Medicine

## 2019-02-23 ENCOUNTER — Ambulatory Visit: Payer: Medicare Other | Admitting: Family Medicine

## 2019-03-17 ENCOUNTER — Ambulatory Visit: Payer: Medicare Other | Admitting: Family Medicine

## 2019-06-29 DIAGNOSIS — J069 Acute upper respiratory infection, unspecified: Secondary | ICD-10-CM | POA: Diagnosis not present

## 2019-06-29 DIAGNOSIS — Z1159 Encounter for screening for other viral diseases: Secondary | ICD-10-CM | POA: Diagnosis not present

## 2019-06-29 DIAGNOSIS — Z0184 Encounter for antibody response examination: Secondary | ICD-10-CM | POA: Diagnosis not present

## 2019-07-01 DIAGNOSIS — Z1159 Encounter for screening for other viral diseases: Secondary | ICD-10-CM | POA: Diagnosis not present

## 2019-07-01 DIAGNOSIS — Z0184 Encounter for antibody response examination: Secondary | ICD-10-CM | POA: Diagnosis not present

## 2019-07-01 DIAGNOSIS — Z712 Person consulting for explanation of examination or test findings: Secondary | ICD-10-CM | POA: Diagnosis not present

## 2019-07-10 DIAGNOSIS — Z03818 Encounter for observation for suspected exposure to other biological agents ruled out: Secondary | ICD-10-CM | POA: Diagnosis not present

## 2019-07-10 DIAGNOSIS — Z712 Person consulting for explanation of examination or test findings: Secondary | ICD-10-CM | POA: Diagnosis not present

## 2019-07-23 DIAGNOSIS — M19011 Primary osteoarthritis, right shoulder: Secondary | ICD-10-CM | POA: Diagnosis not present

## 2019-08-28 ENCOUNTER — Other Ambulatory Visit: Payer: Self-pay

## 2019-08-28 DIAGNOSIS — Z20822 Contact with and (suspected) exposure to covid-19: Secondary | ICD-10-CM

## 2019-08-29 LAB — NOVEL CORONAVIRUS, NAA: SARS-CoV-2, NAA: NOT DETECTED

## 2019-09-07 DIAGNOSIS — Z23 Encounter for immunization: Secondary | ICD-10-CM | POA: Diagnosis not present

## 2019-09-16 ENCOUNTER — Encounter: Payer: Self-pay | Admitting: Gastroenterology

## 2019-09-28 ENCOUNTER — Other Ambulatory Visit: Payer: Self-pay

## 2019-09-28 DIAGNOSIS — Z20822 Contact with and (suspected) exposure to covid-19: Secondary | ICD-10-CM

## 2019-09-29 LAB — NOVEL CORONAVIRUS, NAA: SARS-CoV-2, NAA: NOT DETECTED

## 2019-10-06 ENCOUNTER — Other Ambulatory Visit: Payer: Self-pay

## 2019-10-06 DIAGNOSIS — Z20822 Contact with and (suspected) exposure to covid-19: Secondary | ICD-10-CM

## 2019-10-07 LAB — NOVEL CORONAVIRUS, NAA: SARS-CoV-2, NAA: NOT DETECTED

## 2020-01-04 ENCOUNTER — Telehealth: Payer: Self-pay | Admitting: Internal Medicine

## 2020-01-04 NOTE — Telephone Encounter (Signed)
Spoke with Mathew Stafford regarding both AWV visit and one year follow up visit with Dr. Quay Burow on 01/11/20. Patient stated that he has moved to [place] and no longer resides in [place]. Asked that both visits be cancelled.

## 2020-01-11 ENCOUNTER — Ambulatory Visit: Payer: Medicare Other

## 2020-01-11 ENCOUNTER — Ambulatory Visit: Payer: Medicare Other | Admitting: Internal Medicine
